# Patient Record
Sex: Male | Born: 1965
Health system: Southern US, Community
[De-identification: ages and names within clinical notes are randomized; demographics above are authoritative.]

## PROBLEM LIST (undated history)

## (undated) DIAGNOSIS — I493 Ventricular premature depolarization: Secondary | ICD-10-CM

## (undated) DIAGNOSIS — E785 Hyperlipidemia, unspecified: Secondary | ICD-10-CM

## (undated) DIAGNOSIS — E669 Obesity, unspecified: Secondary | ICD-10-CM

## (undated) DIAGNOSIS — S42009A Fracture of unspecified part of unspecified clavicle, initial encounter for closed fracture: Secondary | ICD-10-CM

## (undated) DIAGNOSIS — E781 Pure hyperglyceridemia: Secondary | ICD-10-CM

## (undated) DIAGNOSIS — G4700 Insomnia, unspecified: Secondary | ICD-10-CM

## (undated) DIAGNOSIS — F528 Other sexual dysfunction not due to a substance or known physiological condition: Secondary | ICD-10-CM

## (undated) DIAGNOSIS — I472 Ventricular tachycardia, unspecified: Secondary | ICD-10-CM

## (undated) DIAGNOSIS — J301 Allergic rhinitis due to pollen: Secondary | ICD-10-CM

## (undated) HISTORY — DX: Hyperlipidemia, unspecified: E78.5

## (undated) HISTORY — DX: Fracture of unspecified part of unspecified clavicle, initial encounter for closed fracture: S42.009A

## (undated) HISTORY — DX: Obesity, unspecified: E66.9

## (undated) HISTORY — DX: Pure hyperglyceridemia: E78.1

## (undated) HISTORY — DX: Allergic rhinitis due to pollen: J30.1

## (undated) HISTORY — DX: Other sexual dysfunction not due to a substance or known physiological condition: F52.8

## (undated) HISTORY — DX: Ventricular premature depolarization: I49.3

## (undated) HISTORY — DX: Insomnia, unspecified: G47.00

## (undated) HISTORY — PX: RHINOPLASTY: SUR1284

## (undated) HISTORY — PX: CYSTECTOMY: SUR359

## (undated) HISTORY — DX: Ventricular tachycardia, unspecified: I47.20

## (undated) HISTORY — DX: Ventricular tachycardia: I47.2

---

## 2002-03-29 ENCOUNTER — Ambulatory Visit (HOSPITAL_BASED_OUTPATIENT_CLINIC_OR_DEPARTMENT_OTHER): Admission: RE | Admit: 2002-03-29 | Discharge: 2002-03-29 | Payer: Self-pay | Admitting: Internal Medicine

## 2002-07-08 ENCOUNTER — Ambulatory Visit (HOSPITAL_BASED_OUTPATIENT_CLINIC_OR_DEPARTMENT_OTHER): Admission: RE | Admit: 2002-07-08 | Discharge: 2002-07-08 | Payer: Self-pay | Admitting: Otolaryngology

## 2005-09-13 ENCOUNTER — Ambulatory Visit: Payer: Self-pay | Admitting: Internal Medicine

## 2005-09-16 ENCOUNTER — Ambulatory Visit: Payer: Self-pay | Admitting: Internal Medicine

## 2007-02-06 ENCOUNTER — Ambulatory Visit: Payer: Self-pay | Admitting: Cardiology

## 2007-02-06 LAB — CONVERTED CEMR LAB
CO2: 28 meq/L (ref 19–32)
Cholesterol: 244 mg/dL (ref 0–200)
Creatinine, Ser: 1.1 mg/dL (ref 0.4–1.5)
GFR calc Af Amer: 95 mL/min
HDL: 40.7 mg/dL (ref >39.0)
Potassium: 4 meq/L (ref 3.5–5.1)
Sodium: 139 meq/L (ref 135–145)
Triglycerides: 89 mg/dL (ref 0–149)
VLDL: 18 mg/dL (ref 0–40)

## 2007-03-13 ENCOUNTER — Ambulatory Visit: Payer: Self-pay

## 2007-03-14 ENCOUNTER — Ambulatory Visit: Payer: Self-pay | Admitting: Internal Medicine

## 2007-03-14 ENCOUNTER — Ambulatory Visit: Payer: Self-pay

## 2007-03-14 ENCOUNTER — Ambulatory Visit: Payer: Self-pay | Admitting: Cardiology

## 2007-03-14 ENCOUNTER — Inpatient Hospital Stay (HOSPITAL_COMMUNITY): Admission: AD | Admit: 2007-03-14 | Discharge: 2007-03-16 | Payer: Self-pay | Admitting: Cardiology

## 2007-03-21 ENCOUNTER — Ambulatory Visit: Payer: Self-pay | Admitting: Internal Medicine

## 2007-03-21 ENCOUNTER — Ambulatory Visit: Payer: Self-pay

## 2007-11-14 ENCOUNTER — Encounter: Payer: Self-pay | Admitting: *Deleted

## 2007-11-14 DIAGNOSIS — E785 Hyperlipidemia, unspecified: Secondary | ICD-10-CM | POA: Insufficient documentation

## 2007-11-14 DIAGNOSIS — Z9189 Other specified personal risk factors, not elsewhere classified: Secondary | ICD-10-CM | POA: Insufficient documentation

## 2007-11-14 DIAGNOSIS — G4700 Insomnia, unspecified: Secondary | ICD-10-CM | POA: Insufficient documentation

## 2007-11-14 DIAGNOSIS — F528 Other sexual dysfunction not due to a substance or known physiological condition: Secondary | ICD-10-CM | POA: Insufficient documentation

## 2008-05-09 ENCOUNTER — Ambulatory Visit (HOSPITAL_BASED_OUTPATIENT_CLINIC_OR_DEPARTMENT_OTHER): Admission: RE | Admit: 2008-05-09 | Discharge: 2008-05-09 | Payer: Self-pay | Admitting: Otolaryngology

## 2008-05-09 ENCOUNTER — Encounter: Payer: Self-pay | Admitting: Pulmonary Disease

## 2008-05-10 ENCOUNTER — Ambulatory Visit: Payer: Self-pay | Admitting: Internal Medicine

## 2008-06-05 ENCOUNTER — Ambulatory Visit: Payer: Self-pay | Admitting: Pulmonary Disease

## 2008-06-05 DIAGNOSIS — G4733 Obstructive sleep apnea (adult) (pediatric): Secondary | ICD-10-CM | POA: Insufficient documentation

## 2008-06-12 ENCOUNTER — Telehealth: Payer: Self-pay | Admitting: Internal Medicine

## 2008-07-03 ENCOUNTER — Ambulatory Visit: Payer: Self-pay | Admitting: Pulmonary Disease

## 2008-08-19 ENCOUNTER — Encounter: Payer: Self-pay | Admitting: Pulmonary Disease

## 2008-10-20 ENCOUNTER — Telehealth (INDEPENDENT_AMBULATORY_CARE_PROVIDER_SITE_OTHER): Payer: Self-pay | Admitting: *Deleted

## 2008-10-23 ENCOUNTER — Telehealth: Payer: Self-pay | Admitting: Pulmonary Disease

## 2008-11-03 ENCOUNTER — Encounter: Payer: Self-pay | Admitting: Pulmonary Disease

## 2008-11-04 ENCOUNTER — Ambulatory Visit: Payer: Self-pay | Admitting: Pulmonary Disease

## 2008-12-08 ENCOUNTER — Telehealth: Payer: Self-pay | Admitting: Internal Medicine

## 2008-12-09 ENCOUNTER — Encounter: Payer: Self-pay | Admitting: Internal Medicine

## 2009-06-11 ENCOUNTER — Telehealth (INDEPENDENT_AMBULATORY_CARE_PROVIDER_SITE_OTHER): Payer: Self-pay | Admitting: *Deleted

## 2009-07-23 ENCOUNTER — Telehealth: Payer: Self-pay | Admitting: Internal Medicine

## 2009-10-06 ENCOUNTER — Ambulatory Visit: Payer: Self-pay | Admitting: Pulmonary Disease

## 2009-12-22 ENCOUNTER — Telehealth: Payer: Self-pay | Admitting: Internal Medicine

## 2009-12-23 ENCOUNTER — Telehealth: Payer: Self-pay | Admitting: Internal Medicine

## 2010-03-17 ENCOUNTER — Telehealth: Payer: Self-pay | Admitting: Internal Medicine

## 2010-09-07 ENCOUNTER — Telehealth: Payer: Self-pay | Admitting: Internal Medicine

## 2010-09-14 NOTE — Assessment & Plan Note (Signed)
Summary: rov for osa   Copy to:  [facility] Primary Provider/Referring Provider:  Debby Bud  CC:  Yearly follow up for sleep apnea.  states he is wearing cpap everynight approx 7 hours.  states he is having trouble with mask-occasionally it is too loose and occasionally it's too tight.  denies problems with pressure.  Marland Kitchen  History of Present Illness: the pt comes in today for f/u of his known severe osa.  He states that he is wearing cpap compliantly, and denies any issues with daytime sleepiness or alertness.  He is having mask issues, but is due to be replaced, and I suspect this is the issue.  Current Medications (verified): 1)  Zolpidem Tartrate 10 Mg  Tabs (Zolpidem Tartrate) .... Take 1 Tab By Mouth At Bedtime As Needed  Allergies (verified): 1)  ! Penicillin  Review of Systems      See HPI  Vital Signs:  Patient profile:   45 year old male Height:      73 inches Weight:      223.50 pounds BMI:     29.59 O2 Sat:      99 % on Room air Temp:     97.7 degrees F oral Pulse rate:   43 / minute BP sitting:   114 / 72  (left arm) Cuff size:   regular  Vitals Entered By: Gweneth Dimitri RN (October 06, 2009 8:48 AM)  O2 Flow:  Room air CC: Yearly follow up for sleep apnea.  states he is wearing cpap everynight approx 7 hours.  states he is having trouble with mask-occasionally it is too loose and occasionally it's too tight.  denies problems with pressure.   Comments Medications reviewed with patient Daytime contact number verified with patient. Gweneth Dimitri RN  October 06, 2009 8:48 AM    Physical Exam  General:  wd male in nad Nose:  no skin breakdown or pressure necrosis from cpap mask   Impression & Recommendations:  Problem # 1:  OBSTRUCTIVE SLEEP APNEA (ICD-327.23)  the pt denies noncompliance, and feels he is sleeping well with no daytime sleepiness issues.  His compliance download shows he is wearing his device 74% of the nights, and he has no significant  leaks.  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  Other Orders: Est. Patient Level II (04540) DME Referral (DME)  Patient Instructions: 1)  continue on cpap, but wear as much as possible during the night. 2)  will send an order to dme to get you a new mask. 3)  followup with me in one year.

## 2010-09-14 NOTE — Progress Notes (Signed)
  Phone Note Refill Request Message from:  Fax from Pharmacy on Dec 22, 2009 2:55 PM  Refills Requested: Medication #1:  ZOLPIDEM TARTRATE 10 MG  TABS Take 1 tab by mouth at bedtime as needed.   Last Refilled: 07/23/2009 [facility], please Advise refill, thank you  Initial call taken by: Ami Bullins CMA,  Dec 22, 2009 2:55 PM  Follow-up for Phone Call        ok for refills as needed  Follow-up by: Jacques Navy MD,  Dec 22, 2009 5:38 PM    Prescriptions: ZOLPIDEM TARTRATE 10 MG  TABS (ZOLPIDEM TARTRATE) Take 1 tab by mouth at bedtime as needed  #30 x 1   Entered by:   Ami Bullins CMA   Authorized by:   Jacques Navy MD   Signed by:   Bill Salinas CMA on 12/23/2009   Method used:   Telephoned to ...       [facility] [address] [phone]* (retail)       [address]       [place], [place]  [address]       Ph: [phone] or [phone]       Fax: [phone]   RxID:   [id number]

## 2010-09-14 NOTE — Progress Notes (Signed)
  Phone Note Refill Request Message from:  Fax from Pharmacy on March 17, 2010 1:06 PM  Refills Requested: Medication #1:  ZOLPIDEM TARTRATE 10 MG  TABS Take 1 tab by mouth at bedtime as needed. [facility] [address]. Please Advise refill.  Initial call taken by: Ami Bullins CMA,  March 17, 2010 1:06 PM  Follow-up for Phone Call        ok for refills as needed  Follow-up by: Jacques Navy MD,  March 17, 2010 3:41 PM    Prescriptions: ZOLPIDEM TARTRATE 10 MG  TABS (ZOLPIDEM TARTRATE) Take 1 tab by mouth at bedtime as needed  #30 x 1   Entered by:   Ami Bullins CMA   Authorized by:   Jacques Navy MD   Signed by:   Bill Salinas CMA on 03/17/2010   Method used:   Telephoned to ...       [facility] [address] [phone]* (retail)       [address]       [place], [place]  [address]       Ph: [phone] or [phone]       Fax: [phone]   RxID:   [id number]

## 2010-09-14 NOTE — Progress Notes (Signed)
Summary: Zolpidem refill  Phone Note Refill Request Message from:  Fax from Pharmacy on Dec 23, 2009 11:43 AM  Refills Requested: Medication #1:  ZOLPIDEM TARTRATE 10 MG  TABS Take 1 tab by mouth at bedtime as needed. Next Appointment Scheduled: none Initial call taken by: Lucious Groves,  Dec 23, 2009 11:43 AM  Follow-up for Phone Call        k, as needed  Follow-up by: Jacques Navy MD,  Dec 24, 2009 5:03 AM    Prescriptions: ZOLPIDEM TARTRATE 10 MG  TABS (ZOLPIDEM TARTRATE) Take 1 tab by mouth at bedtime as needed  #30 x 1   Entered by:   Ami Bullins CMA   Authorized by:   Jacques Navy MD   Signed by:   Bill Salinas CMA on 12/24/2009   Method used:   Telephoned to ...       [facility] [address] [phone]* (retail)       [address]       [place], [place]  [address]       Ph: [phone] or [phone]       Fax: [phone]   RxID:   [id number]

## 2010-09-16 NOTE — Progress Notes (Signed)
  Phone Note Refill Request Message from:  Fax from Pharmacy on September 07, 2010 11:27 AM  Refills Requested: Medication #1:  ZOLPIDEM TARTRATE 10 MG  TABS Take 1 tab by mouth at bedtime as needed.   Last Refilled: 05/22/2010 [facility] [address], Please Advise refills  Initial call taken by: Ami Bullins CMA,  September 07, 2010 11:28 AM  Follow-up for Phone Call        ok to refill as needed  Follow-up by: Jacques Navy MD,  September 07, 2010 11:29 AM    Prescriptions: ZOLPIDEM TARTRATE 10 MG  TABS (ZOLPIDEM TARTRATE) Take 1 tab by mouth at bedtime as needed  #30 x 1   Entered by:   Ami Bullins CMA   Authorized by:   Jacques Navy MD   Signed by:   Bill Salinas CMA on 09/08/2010   Method used:   Telephoned to ...       [facility] [address] [phone]* (retail)       [address]       [place], [place]  [address]       Ph: [phone] or [phone]       Fax: [phone]   RxID:   [id number]

## 2010-11-15 ENCOUNTER — Other Ambulatory Visit: Payer: Self-pay | Admitting: *Deleted

## 2010-11-15 NOTE — Telephone Encounter (Signed)
Ok to refill prn.

## 2010-11-16 MED ORDER — ZOLPIDEM TARTRATE 10 MG PO TABS: 10.0000 mg | ORAL_TABLET | Freq: Every evening | ORAL | Status: DC | PRN

## 2010-11-16 NOTE — Telephone Encounter (Signed)
Left a message on Pharmacy line for PRN refill on Zolpidem.

## 2010-12-28 NOTE — Procedures (Signed)
[facility]                              EXERCISE TREADMILL   NAME:Larry Rivera, [name]                        MRN:          [id number]  DATE:03/21/2007                            DOB:          1966/07/15    PROCEDURE PERFORMED:  Exercise treadmill test.   INDICATIONS:  Previous exercise-induced ventricular tachycardia, now  with the patient on verapamil to see if we can reinduce ventricular  tachycardia on medical therapy.   INTRODUCTION:  The patient is a very pleasant 45 year old man with a  history of tobacco abuse and hypertension, who was here for a treadmill  test just over a week ago and during that treadmill test he walked  approximately 9 minutes on a Bruce protocol and in recovery had very  sustained, very significant salvos of ventricular tachycardia.  These  were not sustained.  They were clinically significant.  They were  associated with lightheadedness and dizziness.  He was hospitalized.  He  ruled out for MI.  He had a catheterization demonstrating no obstructive  coronary disease and preserved LV function.  The patient with all of the  above underwent cardiac MRI scanning, which was negative, and no  evidence of any infiltrative cardiomyopathy and is now referred for  exercise treadmill test to see if he still has exercise-induced  arrhythmias.   PROCEDURE:  After informed consent was obtained, the patient was prepped  in the usual manner.  His initial blood pressure was 118/55, pulse was  80 and irregular.  The patient walked a total of 9 minutes on a Bruce  protocol, completing stage III.  His heart rate increased up to 131  beats per minute.  His blood pressure was initially in the 130s,  subsequently dropped down as low as 108 and then increased to 136 in  recovery.  During exercise the patient had frequent PVCs, sometimes in a  bigeminal and trigeminal fashion, but had no sustained VT.  He had no  nonsustained VT as well.  There  were no EKG changes consistent with  ischemia.  Of note, the patient did have frequent PVCs.  These were with  a right bundle, upright-axis QRS morphology consistent with a left  ventricular outflow tract origin.  As noted before, there was no  evidence of ischemia.   COMPLICATIONS:  There were no immediate procedural complications.   RESULTS:  This demonstrates a clinically and electrically negative  exercise treadmill test.  There was no inducible ventricular  tachycardia.  The patient's procedure was stopped secondary to MD  discretion.   Recommendations at this time will be for the patient to continue taking  verapamil and Lipitor, and I have recommended that he be allowed to  return to work.     Doylene Canning. Ladona Ridgel, MD  Electronically Signed    GWT/MedQ  DD: 03/21/2007  DT: 03/22/2007  Job #: [id number]

## 2010-12-28 NOTE — H&P (Signed)
Larry Rivera, Larry Rivera                 ACCOUNT NO.:  [id number]   MEDICAL RECORD NO.:  [id number]          PATIENT TYPE:  INP   LOCATION:  2807                         FACILITY:  [facility]   PHYSICIAN:  Doylene Canning. Ladona Ridgel, MD    DATE OF BIRTH:  Apr 23, 1966   DATE OF ADMISSION:  03/14/2007  DATE OF DISCHARGE:                              HISTORY & PHYSICAL   CARDIOLOGIST:  Dr. Rollene Rotunda.   PRIMARY CARE PHYSICIAN:  Dr. Debby Bud.   CHIEF COMPLAINT:  Palpitations and chest pain.   HISTORY OF PRESENT ILLNESS:  Larry Rivera is a 45 year old male patient,  without any known history of coronary artery disease, who presented to  the office today for routine exercise treadmill testing.  He recently  saw Dr. Antoine Poche for evaluation of chest pain and palpitations.  He was  set up with a 24-hour Holter monitor, as well as some blood work, an  echocardiogram and his exercise treadmill test.  He completed his  echocardiogram earlier today.  Recent blood work revealed potassium of  4, creatinine 1.1, magnesium is 2.1, total cholesterol 244, HCL 40.7 and  LDL 0.175, TSH 1.79.  Result of his echocardiogram are unknown at this  time.   The patient exercised according to Bruce protocol for 13 minutes,  achieving a work level of 15.3 minutes.  His resting heart rate rose  from 88 beats per minutes to a maximal heart rate of 162 beats per  minute.  His blood pressure rose from 120/83 to maximum of 162/69.  He  denied any chest pain.  He did have some shortness of breath that was  fairly mild.  We achieved 100 percent of his maximum predicted heart  rate and discontinued the test.   In recovery, the patient developed monomorphic ventricular tachycardia.  This was fairly asymptomatic.  His heart rate was 244.  He had two  episodes of this, the longest of which lasted approximately 6 seconds.  The patient denied chest pain, shortness of breath.  He denied any chest  pain, shortness of breath.  He denied any near  syncope.  Of note during  exercise, the patient had no ST-T wave changes to suggest ischemia or  injury.  He did have frequent PVCs, especially early on in the test.  He  did have several episodes of bigeminy and trigeminy before we started  the test, but this resolved during exercise.   The patient did become somewhat lightheaded and diaphoretic, much later  in recovery.  His heart rate at that time was 90, and he was in sinus  rhythm.  We did check his blood pressure and it was 80/50.  We placed  him on IV fluids and bolused him with 250 mL of normal saline.  His  blood pressure returned to 120/80.  Throughout the remainder of  recovery, he remained in sinus rhythm with frequent PVCs, bigeminy and  trigeminy.   PAST MEDICAL HISTORY:  Significant for untreated dyslipidemia.   MEDICATIONS:  Zolpidem p.r.n.   ALLERGIES:  PENICILLIN.   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  He has one 71-  year-old daughter.  He smokes one pack per day for last 20 years.  He  drinks alcohol socially.   FAMILY HISTORY:  Noncontributory for premature coronary artery disease.   REVIEW OF SYSTEMS:  Please see HPI.  Rest of the review of systems are  negative.   PHYSICAL EXAM:  GENERAL:  Well-nourished, well-developed male in no  distress.  VITAL SIGNS:  Blood pressure is as noted above.  Heart rate is as noted  above in HPI.  HEENT is normal.  NECK:  Without JVD.  CARDIAC:  S1, S2.  Regular rate and rhythm with occasional PVCs.  LUNGS:  Clear to auscultation bilaterally without wheezing, rhonchi or  rales.  ABDOMEN:  Nontender.  EXTREMITIES:  No edema.  Calves soft, nontender.  SKIN:  Warm, dry.  NEUROLOGIC:  He is alert and oriented x3.  Cranial 2-  12 grossly intact.  Carotids without bruits bilaterally.   IMPRESSION:  1. Monomorphic ventricular tachycardia, status post routine exercise      treadmill test - spontaneous conversion to normal sinus rhythm.      a.     Fairly  asymptomatic.  2. History of atypical chest pain.  3. Smoker.  4. Untreated dyslipidemia.   PLAN:  The patient was also interviewed and examined by Dr. [name].  We plan to admit him to [facility].  We will place in  a step-down unit and start him on aspirin, beta blocker and Statin  therapy.  Will also place him on IV heparin.  Will check serial cardiac  markers.  We recommend proceeding with cardiac catheterization.  Risks  and benefits have been discussed with patient. He agrees proceed.  This  will be planned for tomorrow.  If his coronaries are normal, then he  will require electrophysiology study.      Larry Newcomer, PA-C      Doylene Canning. Ladona Ridgel, MD  Electronically Signed    SW/MEDQ  D:  03/14/2007  T:  03/15/2007  Job:  [id number]   cc:   Rosalyn Gess. Norins, MD

## 2010-12-28 NOTE — Assessment & Plan Note (Signed)
[facility]                            CARDIOLOGY OFFICE NOTE   NAME:Rivera, Larry JIVIDEN                        MRN:          [id number]  DATE:02/06/2007                            DOB:          1966/05/18    PRIMARY CARE PHYSICIAN:  Dr. Illene Regulus.   REASON FOR REFERAL:  Evaluate patient with palpitations and pre syncope.   HISTORY OF PRESENT ILLNESS:  The patient is a pleasant 45 year old  gentleman with no prior cardiac history. He has noticed for some time  now episodes of feeling flushed with cold sweats and slightly  lightheaded. This happens sporadically. He has not had any in a month  and a half. His wife does notice that he occasionally has some irregular  heart beats when she is listening or has her head on his chest. He does  not really notice these. He is not sure whether it correlates. He thinks  he has more of these symptoms when he is drinking a lot of caffeine. He  does not exercise routinely. His activities of daily living can not  bring these symptoms on. He is not having any chest discomfort, neck, or  arm discomfort. He is not having any PND or orthopnea. He may get winded  doing significant activity. His exercise tolerance has been reasonable.  He is a longstanding smoker.   PAST MEDICAL HISTORY:  He has no history of hypertension, diabetes,  hyperlipidemia.   PAST SURGICAL HISTORY:  Cyst removed.   ALLERGIES:  PENICILLIN.   MEDICATIONS:  Zolpidem.   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 He has  one 53 year old daughter. He has been smoking 1 pack per day for 20  years. He drinks alcohol socially.   FAMILY HISTORY:  Noncontributory for early coronary artery disease.  There is a history of diabetes in his family.   REVIEW OF SYSTEMS:  Positive for erectile dysfunction. Negative for  other systems.   PHYSICAL EXAMINATION:  The patient is well-appearing and in no distress.  Blood pressure 125/80,  heart rate 70 regular, weight 218 pounds, body  mass index 28.  HEENT: Eyelids unremarkable, pupils equal, round, and reactive to light.  Fundi not visualized. Oral mucosa unremarkable.  NECK: No jugular venous distension at 45 degrees. Carotid upstroke brisk  and symmetric. No bruits or thyromegaly.  LYMPHATICS: No cervical, axillary, or inguinal adenopathy.  LUNGS: Clear to auscultation bilaterally.  BACK: No costovertebral angle tenderness.  CHEST: Unremarkable.  HEART: PMI not displaced or sustained. S1 and S2 within normal limits.  No S3. No S4, clicks, rubs, no murmurs.  ABDOMEN: Obese, positive bowel sounds normal to frequency and pitch. No  bruits, rebound, guarding. No midline pulsatile mass. No hepatomegaly,  no splenomegaly.  SKIN: No rashes. No nodules.  EXTREMITIES: 2 + pulses throughout. No edema, no cyanosis, no clubbing.  NEURO: Oriented to person, place, and time. Cranial nerves II-XII  grossly intact. Motor grossly intact.   EKG: Sinus rhythm, rate 70, axis within normal limits, intervals within  normal limits, sinus arrhythmia. No acute ST-T wave  changes.   ASSESSMENT/PLAN:  1. Palpitations, the patient did have some ectopy while I was      listening. I am going to get a 24 hour Holter monitor. I am going      to get a TSH, BMET, and magnesium. I will check an echocardiogram.      Further evaluation will be based on these results. He is going to      be instructed that he needs to avoid caffeine as a first step.  2. Erectile dysfunction, I would give the patient a prescription for      Viagra or other such drug. Given his risk factors, I think an      exercise treadmill test to rule out cardiovascular disease is      warranted. We will bring him back and do an exercise treadmill test      and then I will give him a prescription for exercise based on this.  3. Tobacco, I spent a long time talking about the need to stop      smoking. He is not allowed to take  Chantix according to his job. He      will consider Wellbutrin if he can not quit cold [place].  4. Erectile dysfunction as above.  5. Follow up, I will see him at the time of his treadmill.     Rollene Rotunda, MD, [facility]  Electronically Signed    JH/MedQ  DD: 02/06/2007  DT: 02/06/2007  Job #: [id number]   cc:   Rosalyn Gess. Norins, MD

## 2010-12-28 NOTE — Cardiovascular Report (Signed)
Larry Rivera, Larry Rivera                 ACCOUNT NO.:  [id number]   MEDICAL RECORD NO.:  [id number]          PATIENT TYPE:  INP   LOCATION:  2915                         FACILITY:  [facility]   PHYSICIAN:  Arturo Morton. Riley Kill, MD, FACCDATE OF BIRTH:  [birth year]   DATE OF PROCEDURE:  03/15/2007  DATE OF DISCHARGE:                            CARDIAC CATHETERIZATION   INDICATIONS:  [name] is a 45 year old gentleman who presents with  exercise-induced ventricular tachycardia and presyncope.  This was  provoked on the treadmill.  The current study was done to assess  coronary anatomy.  He has a history of hypercholesterolemia, and he also  has a history of smoking.   PROCEDURE:  1. Left heart catheterization.  2. Selective coronary territory.  3. Selective left ventriculography.   DESCRIPTION OF PROCEDURE:  The patient was brought to the  catheterization laboratory, prepped and draped in usual fashion.  Through an anterior puncture, the right femoral artery was easily  entered.  A 5-French sheath was placed.  Views of the left and right  coronary arteries were then obtained.  A no-torque catheter was used for  right coronary injection.  Central aortic and left ventricular pressures  measured with pigtail.  Ventriculography was performed in the RAO  projection.  The LVEDP was low and blood pressure was borderline so 250  mL of normal saline was administered through the course of the  procedure.  He tolerated this well and was taken to the holding area in  satisfactory clinical condition.  ACT was checked at the end of the  case.   HEMODYNAMIC DATA:  1. Central aortic pressure was 88/63, mean 75.  2. Left ventricular pressure 95/5.  3. No gradient pull-back across aortic valve.   ANGIOGRAPHIC DATA:  1. Ventriculography done in the RAO projection.  Overall systolic      function appears at the low normal range.  Estimated ejection      fraction would be in the 50% range.  Does not appear to  be      significant regurgitation.  There is not a definite wall motion      abnormality.  2. The left main is free of critical disease.  3. The LAD is a large vessel with perhaps some mild ectasia.  There is      minimal luminal irregularity.  There are two diagonal branches, all      of which appear free of critical disease.  4. The circumflex provides a large marginal with multiple branches.      It is free of critical disease.  5. Right coronary is large-caliber vessel without significant focal      narrowing in multiple posterolateral branches.   CONCLUSION:  1. Low normal ejection fraction.  2. No significant coronary obstruction.  3. Exercise-induced ventricular tachycardia of uncertain etiology.   PLAN:  The patient is to have a cardiac MRI and be seen in follow-up  also with the electrophysiologic service.  I will speak with Dr. [name] about his case.      Arturo Morton. Riley Kill, MD, [name]  Electronically Signed     TDS/MEDQ  D:  03/15/2007  T:  03/16/2007  Job:  [id number]   cc:   Doylene Canning. Ladona Ridgel, MD  Noralyn Pick Eden Emms, MD, [facility]  CV Laboratory

## 2010-12-28 NOTE — Procedures (Signed)
NAME:  Larry Rivera, Larry Rivera                 ACCOUNT NO.:  [id number]   MEDICAL RECORD NO.:  [id number]          PATIENT TYPE:  OUT   LOCATION:  SLEEP CENTER                 FACILITY:  [facility]   PHYSICIAN:  Clinton D. Maple Hudson, MD, FCCP, FACPDATE OF BIRTH:  07/29/1966   DATE OF STUDY:  05/09/2008                            NOCTURNAL POLYSOMNOGRAM   REFERRING PHYSICIAN:  Onalee Hua L. Annalee Genta, M.D.   INDICATION FOR STUDY:  Hypersomnia with sleep apnea.   EPWORTH SLEEPINESS SCORE:  Endorsed by patient as zero/24, BMI 30,  weight 225 pounds, height 73 inches, neck 17 inches.   HOME MEDICATION:  None reported.   SLEEP ARCHITECTURE:  Split study protocol.  During the diagnostic phase,  total sleep time was 119.5 minutes with sleep efficiency 86.3%.  Stage 1  was 2.1%, stage 2 was 89.1%, and stage 3 was 5.9%, REM 2.9% of total  sleep time.  Sleep latency 9 minutes, REM latency 124 minutes, awake  after sleep onset 10 minutes, arousal index 47.2 indicating increased  EEG arousal.  Zolpidem was taken at bedtime.   RESPIRATORY DATA:  Split study protocol.  Apnea/hypopnea index (AHI)  86.4 per hour.  172 events were scored including 105 obstructive apneas,  114 central apneas, 11 mixed apneas, and 42 hypopneas before CPAP.  All  events were recorded as supine.  REM AHI 102.9.  CPAP was titrated to 12  CWP, AHI 4.1 per hour.  He chose a large ResMed Mirage Quattro mask with  heated humidifier.   OXYGEN DATA:  Loud snoring before CPAP with oxygen desaturation to a  nadir of 86%.  After CPAP control, mean oxygen saturation held 96.4% on  room air.   CARDIAC DATA:  Normal sinus rhythm with occasional PVC.   MOVEMENT/PARASOMNIA:  Occasional limb jerk during CPAP titration with an  arousal index of 3.  Bathroom x1.   IMPRESSION/RECOMMENDATION:  1. Severe obstructive sleep apnea/hypopnea syndrome, apnea/hypopnea      index 86.4 per hour with all events recorded while supine, loud      snoring with oxygen  desaturation to a nadir of 86%.  2. Successful continuous positive airway pressure (CPAP) titration to      12 centimeters of water, apnea/hypopnea index 4.1 per hour.  He      chose a large ResMed Mirage Quattro mask with heated humidifier.      Clinton D. Maple Hudson, MD, [facility], FACP  Diplomate, Biomedical engineer of Sleep Medicine  Electronically Signed     CDY/MEDQ  D:  05/10/2008 10:53:04  T:  05/11/2008 15:37:40  Job:  [id number]

## 2010-12-28 NOTE — Consult Note (Signed)
Larry Rivera, Larry Rivera                 ACCOUNT NO.:  [id number]   MEDICAL RECORD NO.:  [id number]          PATIENT TYPE:  INP   LOCATION:  6523                         FACILITY:  [facility]   PHYSICIAN:  Doylene Canning. Ladona Ridgel, MD    DATE OF BIRTH:  Oct 31, 1965   DATE OF CONSULTATION:  03/15/2007  DATE OF DISCHARGE:                                 CONSULTATION   REQUESTED BY:  Dr. Charlton Haws.   INDICATIONS FOR CONSULTATION:  Evaluation of symptomatic ventricular  tachycardia.   HISTORY OF PRESENT ILLNESS:  The patient is a 45 year old man with a  history of tobacco abuse and atypical chest pain, who was seen by my  partner, Dr. Antoine Poche, initially for evaluation of all of the above.  An  exercise treadmill test was ordered.  The patient notes that prior to  his exercise test, he had 7 or 8 cups of caffeine (Expressor).  The  patient subsequently underwent exercise treadmill testing where he  walked for 12 minutes on a Bruce protocol.  There were no EKG changes.  Approximate 1-1/2 minutes into recovery, the patient developed  ventricular tachycardia at a cycle length of 300 milliseconds.  This  tachycardia was a right bundle branch block tachycardia with positive  precordial concordance.  Lead I was negative, as was aVL, as was aVR,  though less so.  The patient's tachycardia spontaneously terminated.  He  did not have syncope.  Following this, the patient was in ventricular  bigeminy.  He subsequently was admitted to the hospital and underwent  catheterization by Dr. Bonnee Quin, which demonstrated preserved LV  function, no obvious focal wall motion abnormalities and normal coronary  arteries with no evidence of obstruction.  He has had some ventricular  ectopy on the monitor, but has otherwise been stable.   PAST MEDICAL HISTORY:  As noted in the HPI; otherwise, he has been quite  healthy.  Patient has a surgical history notable for removal of a cyst.   ALLERGIES:  PENICILLIN.   SOCIAL  HISTORY:  The patient works as a Naval architect.  He is married.  He has a 1-pack-per-day smoking history and has done so for 20 years.  He drinks alcohol socially.   FAMILY HISTORY:  Negative for coronary disease.   REVIEW OF SYSTEMS:  Notable for erectile dysfunction, but otherwise all  systems reviewed and found to be negative.   PHYSICAL EXAMINATION:  GENERAL:  He is a pleasant, well-appearing young  man in no acute distress.  VITAL SIGNS:  The blood pressure was 100/70, the pulse was 60 and  irregular, the respirations were 18 and temperature was 98.  HEENT:  Normocephalic and atraumatic.  Pupils were equal and round.  The  oropharynx was moist.  The sclerae were anicteric.  NECK:  Revealed no jugular distention.  There is no thyromegaly.  Trachea is midline.  The carotid are 2+ and symmetric.  LUNGS:  Clear bilaterally to auscultation, no wheezes, rales or rhonchi  were present.  There is no increased work of breathing.  CARDIOVASCULAR:  Exam revealed an irregular rhythm with  normal S1 and S2.  There are no  murmurs, rubs or gallops present.  ABDOMEN:  Soft, nontender and distended.  There was no organomegaly.  The bowel sounds are present.  There is no rebound or guarding.  EXTREMITIES:  Demonstrated no cyanosis, clubbing or edema.  Pulses were  2+ and symmetric.  NEUROLOGIC:  Alert and oriented x3. His cranial nerves were intact.  The  strength is 5/5 and symmetric.  SKIN:  Exam was normal.   EKG:  Baseline ECG demonstrates sinus rhythm with normal axis and  intervals.   IMPRESSION:  1. Nonsustained, but symptomatic ventricular tachycardia with a right      bundle and left inferior axis positive precordial concordance QRS      morphology.  2. Atypical chest pain with catheterization demonstrating no coronary      artery disease.  3. Tobacco abuse.   DISCUSSION:  The patient's mechanism of his VT likely is automatic,  perhaps adenosine sensitive and originating from the  left versus the  noncoronary cusp of the left ventricular outflow tract.  I have  recommended that we start verapamil and rechallenge the patient next  week with exercise treadmill testing.  Catheter ablation will be another  option, but for now I would recommend medical therapy to see if we can  suppress his VT.  If his VT is in fact suppressible with medical  therapy, then returning to driving would be a reasonable recommendation.      Doylene Canning. Ladona Ridgel, MD  Electronically Signed     GWT/MEDQ  D:  03/15/2007  T:  03/16/2007  Job:  [id number]   cc:   Rollene Rotunda, MD, Simone Curia. Norins, MD

## 2010-12-28 NOTE — Discharge Summary (Signed)
NAMEKOI, ZANGARA                 ACCOUNT NO.:  [id number]   MEDICAL RECORD NO.:  [id number]          PATIENT TYPE:  INP   LOCATION:  6523                         FACILITY:  [facility]   PHYSICIAN:  Peter C. Eden Emms, MD, FACCDATE OF BIRTH:  January 21, 1966   DATE OF ADMISSION:  03/14/2007  DATE OF DISCHARGE:  03/16/2007                         DISCHARGE SUMMARY - REFERRING   DISCHARGE DIAGNOSES:  1. Atypical chest discomfort with clean coronaries on cardiac      catheterization.  2. Monomorphic ventricular tachycardia during recovery on his stress      test.  3. Tobacco use.  4. Hyperlipidemia.   PROCEDURES PERFORMED:  Cardiac catheterization on March 15, 2007 by Dr.  Riley Kill.   BRIEF HISTORY:  Mr. Quant is a 45 year old male who was recently seen by  Dr. Antoine Poche secondary to palpitations and atypical chest discomfort.  An exercise stress test was set up to rule out ischemic heart disease.  He did well on the exercise portion without chest discomfort or EKG  changes.  However, in recovery, he developed monomorphic ventricular  tachycardia with a heart rate of 244.  He broke on his own and was  asymptomatic.  An echocardiogram had also been done in the office,  however, is pending this dictation.  He was admitted to the hospital for  further evaluation.   His history is also notable for tobacco use and hyperlipidemia that it  is untreated.   LABORATORY:  Cardiac MRI showed normal cardiac chambers, EF 58% without  wall motion abnormalities, no evidence of RV dysplasia or fatty  infiltration of the right ventricular free wall.   Laboratory data shows admission H&H 14.0 and 40.8, normal indices,  platelets 271, WBCs 11.3.  At the time of discharge, H&H was 12.9 and  38.3, normal indices, platelets 250, WBCs 9.4.  PTT 30, PT 13.8.  Sodium  137, potassium 4.0, BUN 9, creatinine 1.08.  Normal LFTs.  Prior to  discharge, sodium 138, potassium 3.6, BUN 10, creatinine 1.0, glucose  96.   CK-MBs, relative indices and troponins were within normal limits.  TSH was 1.150.  EKG showed normal sinus rhythm, normal axis.   HOSPITAL COURSE:  The patient was admitted to 6500, placed on IV  heparin.  Given his ventricular tachycardia, it was felt that he should  undergo cardiac catheterization to rule out coronary artery disease.  Catheterization was performed on March 15, 2007 by Dr. Riley Kill.  This did  not show any evidence of coronary artery disease, normal LV function.  Cardiac MRI was performed.  Tobacco cessation consult was performed.  EP  consult was obtained.  Dr. Ladona Ridgel described a ventricular tachycardia  with a right bundle, left inferior axis, with positive precordial  concordant.  He felt that his mechanism of his VT was likely autonomic,  question adenosine sensitive and likely originating from the left versus  noncoronary cusp.  He recommended beginning verapamil or rechallenging  him on a stress test next week.  If he did not have any further  ventricular tachycardia or verapamil, then he could return to driving,  as SVT  is not sustained and unassociated with syncope.  On August 1, he  did not have any further dysrhythmias Dr. Ladona Ridgel followed up,  recommended discontinuing beta blocker.  He received 180 mg of verapamil  prior to his discharge.   DISPOSITION:  The patient is discharged home.   His medications include:  1. Aspirin 325 mg daily.  2. Lipitor 40 mg every night.  3. Ambien 10 mg every night.  4. Verapamil SR 240 mg daily.   According to Dr. Ladona Ridgel, he would like him to have an exercise treadmill  on March 21, 2007 with a follow-up appointment with him right after the  treadmill.  The office will call him with these arrangements.  He will  need blood work in 6 to 8 weeks in regards to FLP and LFTs.  Since  Lipitor was initiated, he was advised no smoking or tobacco products, to  bring all medications to all appointments.  He will follow up with Dr.   Antoine Poche as scheduled.   Discharge time 35 minutes.  Primary care physician is Dr. Debby Bud.      Joellyn Rued, PA-C      Noralyn Pick. Eden Emms, MD, [facility]  Electronically Signed    EW/MEDQ  D:  03/16/2007  T:  03/16/2007  Job:  [id number]   cc:   Rosalyn Gess. Norins, MD

## 2010-12-31 NOTE — Op Note (Signed)
NAME:  Larry Rivera, Larry Rivera                           ACCOUNT NO.:  [id number]   MEDICAL RECORD NO.:  [id number]                   PATIENT TYPE:  AMB   LOCATION:  DSC                                  FACILITY:  [facility]   PHYSICIAN:  Legend L. Annalee Genta, M.D.            DATE OF BIRTH:  01/03/1966   DATE OF PROCEDURE:  07/08/2002  DATE OF DISCHARGE:                                 OPERATIVE REPORT   PREOPERATIVE DIAGNOSES:  1. Severely deviated nasal septum with nasal obstruction.  2. History of nasal trauma.  3. Inferior turbinate and intramural cautery.  4. History of obstructive sleep apnea.   POSTOPERATIVE DIAGNOSES:  1. Severely deviated nasal septum with nasal obstruction.  2. History of nasal trauma.  3. Inferior turbinate and intramural cautery.  4. History of obstructive sleep apnea.   OPERATION PERFORMED:  1. Nasal septal reconstruction.  2. Bilateral inferior turbinate intramural cautery.   SURGEON:  Kinnie Scales. Annalee Genta, M.D.   ANESTHESIA:  General endotracheal.   COMPLICATIONS:  None.   ESTIMATED BLOOD LOSS:  Less than 50 cc.   The patient was transferred from the operating room to the recovery room in  stable condition.   INDICATIONS FOR PROCEDURE:  The patient is a 45 year old white male who has  been followed with a history of progressive night time airway obstruction,  severe nasal congestion and deviated nasal septum.  Inpatient sleep study  was performed which showed an REI of 16 events per hour.  The patient had  complaints of daytime fatigue and hypersomnolence associated with  obstructive sleep apnea.  He had a severely deviated nasal septum and was  unable to tolerate nasal CPAP secondary to sensation of obstruction and  congestion.  Given the patient's history and examination, I recommended that  we consider him for nasal septal reconstruction and bilateral inferior  turbinate and intramural cautery in order to improve his nasal airway,  improve night time  breathing  and perhaps to facilitate night time use of  CPAP.  The risks, benefits and possible complications of the surgical  procedure were discussed in detail with the patient and his wife, who  understood and concurred with our plan for surgery which was scheduled as  above.   DESCRIPTION OF PROCEDURE:  The patient was brought to the operating room on  July 08, 2002 and placed in supine position on the operating table.  General endotracheal anesthesia was established without difficulty and when  the patient was adequately anesthetized, he was injected with approximately  10 cc of 1% lidocaine 1:100,000 dilution of epinephrine injected in  submucosal fashion along the nasal septum and inferior turbinates  bilaterally.  The patient's nose was then packed with Afrin soaked cottonoid  pledgets.  These were left in place for approximately 10 minutes for  vasoconstriction.  The patient was then prepped and draped in sterile  fashion.  The surgical procedure was  begun with removal of nasal Cottonoids  and bilateral nasal examination.  The patient had a severely deviated  anterior nasal septum with the columellar cartilage completely deviated to  the left outside the natural mucoperichondrial pocket with near complete  obstruction of the left anterior nasal cavity.  The patient also had  severely deviated bony and cartilaginous septum in the mid and posterior  aspects of the nasal cavity occluding the right hand side, and medial side  with inferior turbinate hypertrophy.  The procedure was begun by creating a  left hemitransfixion incision.  This was carried through the mucosa, the  underlying submucosa and a mucoperichondrial flap was elevated from anterior  to posterior along the left aspect of the nasal septum.  The overlying  mucosa was preserved.  The bony cartilaginous junction was crossed at the  midline and mucoperiosteal flap was elevated along the patient's right hand  side.   Deviated bone and cartilage from the mid, posterior and inferior  aspects of the nasal septum were then resected with a 4 mm osteotome,  reserving the anterior dorsal and columellar cartilage.  With the bony  portion of the septum in midline position, attention was then turned to the  anterior aspect of the nasal septum where there was a severe  deviation of  the anterior columellar cartilage with complete obstruction of the left  nasal cavity.  An incision was created along the patient's right hand side,  creating a full through-and-through transfixion incision and the cartilage  was freed from the mucoperichondrial pocket.  Dissection was then carried  out bilaterally allowing complete dissection of the anterior nasal septal  cartilage.  Large curvilinear fractured spur of cartilage was then resected  and the cartilage was mobilized from the anterior maxillary crest.  It was  then moved into a midline position and the natural mucoperichondrial pocket  and lower lateral cartilage crura were then positioned properly along each  side of the nasal septum.  Using a 4-0 PDS suture, the anterior columellar  cartilage was sutured into its pocket and additional previously resected  cartilage was morselized and placed posteriorly in order to create better  strength and straightening of the nasal septum.  Mucoperichondrial flaps  were then reapproximated with a 4-0 gut suture on a Keith needle in a  horizontal mattressing fashion with multiple sutures made to reapproximate  the mucoperichondrium and close the transfixion incision.  Bilateral Doyle  nasal septal splints were then placed on each side of the nasal septum after  the application of Bactroban ointment and was sutured into position with 3-0  Ethilon suture.   The inferior turbinate cautery was then performed with cautery set at 12 watts, two passes were made in the submucosal fashion in each inferior  turbinates.  Both turbinates had been  adequately cauterized, they were  outfractured to create a more patent nasal cavity.  The patient's nasal  cavity was irrigated and suctioned.  The oral cavity was suctioned and the  orogastric tube was passed into the stomach and contents were aspirated.  The patient was awakened from his anesthetic.  He was extubated and was then  transferred from the operating room to the recovery room in stable  condition.  There were no complications.  The estimated blood loss was less  than 50 cc.  Kinnie Scales. Annalee Genta, M.D.    DLS/MEDQ  D:  11/91/4782  T:  07/08/2002  Job:  [id number]

## 2011-02-18 ENCOUNTER — Telehealth: Payer: Self-pay | Admitting: *Deleted

## 2011-02-18 MED ORDER — ZOLPIDEM TARTRATE 10 MG PO TABS: 10.0000 mg | ORAL_TABLET | Freq: Every evening | ORAL | Status: DC | PRN

## 2011-02-18 NOTE — Telephone Encounter (Signed)
Refill request on Zolpidem tartrate 10 mg tablet SIG 1 tablet at bedtime prn. QTY 30 last filled 12/17/2010. Please Advise refills

## 2011-02-18 NOTE — Telephone Encounter (Signed)
OK x 5

## 2011-05-30 LAB — CARDIAC PANEL(CRET KIN+CKTOT+MB+TROPI)
CK, MB: 1.2
CK, MB: 1.8
Relative Index: 1.3
Relative Index: INVALID
Total CK: 96

## 2011-05-30 LAB — TSH: TSH: 1.15

## 2011-05-30 LAB — CBC
HCT: 38.3 — ABNORMAL LOW
HCT: 40.3
HCT: 40.8
Hemoglobin: 12.9 — ABNORMAL LOW
Hemoglobin: 13.6
Hemoglobin: 14
MCHC: 33.8
MCHC: 34.2
MCV: 90.1
MCV: 90.7
RBC: 4.5
RDW: 13.8
RDW: 13.4
WBC: 10.2

## 2011-05-30 LAB — PROTIME-INR: Prothrombin Time: 13.8

## 2011-05-30 LAB — BASIC METABOLIC PANEL
CO2: 27
Glucose, Bld: 96
Potassium: 3.6
Sodium: 138

## 2011-05-30 LAB — COMPREHENSIVE METABOLIC PANEL
ALT: 34
CO2: 29
Calcium: 8.9
GFR calc non Af Amer: >60
Glucose, Bld: 145 — ABNORMAL HIGH
Sodium: 137

## 2011-08-08 ENCOUNTER — Ambulatory Visit: Payer: Self-pay | Admitting: Pulmonary Disease

## 2011-08-29 ENCOUNTER — Telehealth: Payer: Self-pay | Admitting: *Deleted

## 2011-08-29 NOTE — Telephone Encounter (Signed)
Refill request for Ambien 10mg . With 5 refills. Last refilled on 8.5.12. Pt has not been seen within a year. OK to refill?

## 2011-08-30 MED ORDER — ZOLPIDEM TARTRATE 10 MG PO TABS: 10.0000 mg | ORAL_TABLET | Freq: Every evening | ORAL | Status: DC | PRN

## 2011-08-30 NOTE — Telephone Encounter (Signed)
Ok for 1 month with 1 refill. Needs OV-CPX, cannot find any notes since 2008 for IM, before any additional refills.

## 2011-08-30 NOTE — Telephone Encounter (Signed)
Phoned in. & notified pt he must schedule an OV for further refills.

## 2011-11-09 ENCOUNTER — Other Ambulatory Visit (INDEPENDENT_AMBULATORY_CARE_PROVIDER_SITE_OTHER): Payer: 59

## 2011-11-09 ENCOUNTER — Ambulatory Visit (INDEPENDENT_AMBULATORY_CARE_PROVIDER_SITE_OTHER): Payer: 59 | Admitting: Internal Medicine

## 2011-11-09 ENCOUNTER — Encounter: Payer: Self-pay | Admitting: Internal Medicine

## 2011-11-09 VITALS — BP 110/82 | HR 55 | Temp 97.6°F | Resp 16 | Ht 72.25 in | Wt 226.5 lb

## 2011-11-09 DIAGNOSIS — G4700 Insomnia, unspecified: Secondary | ICD-10-CM

## 2011-11-09 DIAGNOSIS — Z0000 Encounter for general adult medical examination without abnormal findings: Secondary | ICD-10-CM

## 2011-11-09 DIAGNOSIS — Z136 Encounter for screening for cardiovascular disorders: Secondary | ICD-10-CM

## 2011-11-09 DIAGNOSIS — E785 Hyperlipidemia, unspecified: Secondary | ICD-10-CM

## 2011-11-09 DIAGNOSIS — F528 Other sexual dysfunction not due to a substance or known physiological condition: Secondary | ICD-10-CM

## 2011-11-09 LAB — COMPREHENSIVE METABOLIC PANEL
BUN: 14 mg/dL (ref 6–23)
CO2: 29 mEq/L (ref 19–32)
Creatinine, Ser: 1 mg/dL (ref 0.4–1.5)
GFR: 82.58 mL/min (ref >60.00)
Glucose, Bld: 106 mg/dL — ABNORMAL HIGH (ref 70–99)
Total Bilirubin: 0.6 mg/dL (ref 0.3–1.2)

## 2011-11-09 LAB — LDL CHOLESTEROL, DIRECT: Direct LDL: 162.9 mg/dL

## 2011-11-09 LAB — LIPID PANEL
Cholesterol: 223 mg/dL — ABNORMAL HIGH (ref 0–200)
Triglycerides: 127 mg/dL (ref 0.0–149.0)

## 2011-11-09 MED ORDER — ZOLPIDEM TARTRATE 10 MG PO TABS: 10.0000 mg | ORAL_TABLET | Freq: Every evening | ORAL | Status: DC | PRN

## 2011-11-09 NOTE — Progress Notes (Signed)
Subjective:    Patient ID: Larry Rivera, male    DOB: May 19, 1966, 46 y.o.   MRN: [id number]  HPI Mr. Wiedeman presents for a routine medical exam. He is feeling well with no major illness, surgery, injury.  Past Medical History  Diagnosis Date  . Psychosexual dysfunction with inhibited sexual excitement   . Other and unspecified hyperlipidemia   . Paroxysmal ventricular tachycardia   . Insomnia, unspecified   . Allergic rhinitis due to pollen   . Unspecified part of closed fracture of clavicle    Past Surgical History  Procedure Date  . Cystectomy     Back of neck  . Rhinoplasty     Deviated septum   Family History  Problem Relation Age of Onset  . Lung disease Maternal Grandmother   . Stroke Maternal Grandmother   . Rheum arthritis Mother   . Diabetes Mother     neuropathy  . Hyperlipidemia Father   . Diabetes Sister   . Cancer Neg Hx   . Heart disease Neg Hx    History   Social History  . Marital Status: Married    Spouse Name: N/A    Number of Children: 1  . Years of Education: 12   Occupational History  . 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 Smoking status: Current Everyday Smoker    Types: Cigars  . Smokeless tobacco: Never Used   Comment: Cigar Only: not every day.  . Alcohol Use: Yes     rare drink  . Drug Use: No  . Sexually Active: Yes -- Male partner(s)   Other Topics Concern  . Not on file   Social History Narrative   [employer]. Married '91. 1 dtr '95. Work - Naval architect - long haul.       Review of Systems Constitutional:  Negative for fever, chills, activity change and unexpected weight change.  HEENT:  Negative for hearing loss, ear pain, congestion, neck stiffness and postnasal drip. Negative for sore throat or swallowing problems. Negative for dental complaints.   Eyes: Negative for vision loss or change in visual acuity.  Respiratory: Negative for chest tightness and wheezing. Negative for DOE.   Cardiovascular:  Negative for chest pain or palpitations. No decreased exercise tolerance Gastrointestinal: No change in bowel habit. No bloating or gas. No reflux or indigestion Genitourinary: Negative for urgency, frequency, flank pain and difficulty urinating.  Musculoskeletal: Negative for myalgias, back pain, arthralgias and gait problem.  Neurological: Negative for dizziness, tremors, weakness and headaches.  Hematological: Negative for adenopathy.  Psychiatric/Behavioral: Negative for behavioral problems and dysphoric mood.       Objective:   Physical Exam Filed Vitals:   11/09/11 0906  BP: 110/82  Pulse: 55  Temp: 97.6 F (36.4 C)  Resp: 16  Weight: 226 lb 8 oz (102.74 kg)   Gen'l: Well nourished well developed white male in no acute distress  HEENT: Head: Normocephalic and atraumatic. Right Ear: External ear normal. EAC/TM nl. Left Ear: External ear normal.  EAC/TM nl. Nose: Nose normal. Mouth/Throat: Oropharynx is clear and moist. Dentition - native, in good repair. No buccal or palatal lesions. Posterior pharynx clear. Eyes: Conjunctivae and sclera clear. EOM intact. Pupils are equal, round, and reactive to light. Right eye exhibits no discharge. Left eye exhibits no discharge. Neck: Normal range of motion. Neck supple. No JVD present. No tracheal deviation present. No thyromegaly present.  Cardiovascular: Normal rate, regular rhythm, no friction rub. S3 most  prominent at LSB, diastolic murmur noted, short runs of regular tachycardia (3-4 beats).      Quiet precordium. 2+ radial and DP pulses . No carotid bruits Pulmonary/Chest: Effort normal. No respiratory distress or increased WOB, no wheezes, no rales. No chest wall deformity or CVAT. Abdominal: Soft. Bowel sounds are normal in all quadrants. He exhibits no distension, no tenderness, no rebound or guarding, No heptosplenomegaly  Genitourinary:  deferred Musculoskeletal: Normal range of motion. He exhibits no edema and no tenderness.        Small and large joints without redness, synovial thickening or deformity. Full range of motion preserved about all small, median and large joints.  Lymphadenopathy:    He has no cervical or supraclavicular adenopathy.  Neurological: He is alert and oriented to person, place, and time. CN II-XII intact. DTRs 2+ and symmetrical biceps, radial and patellar tendons. Cerebellar function normal with no tremor, rigidity, normal gait and station.  Skin: Skin is warm and dry. No rash noted. No erythema.  Psychiatric: He has a normal mood and affect. His behavior is normal. Thought content normal.   12 lead EKG and rhythm strip: PVC's with compensatory pause, bigeminy  Lab Results  Component Value Date   WBC 9.4 03/16/2007   HGB 12.9* 03/16/2007   HCT 38.3* 03/16/2007   PLT 250 03/16/2007   GLUCOSE 106* 11/09/2011   CHOL 223* 11/09/2011   TRIG 127.0 11/09/2011   HDL 44.40 11/09/2011   LDLDIRECT 162.9 11/09/2011   ALT 30 11/09/2011   AST 20 11/09/2011   NA 139 11/09/2011   K 4.3 11/09/2011   CL 104 11/09/2011   CREATININE 1.0 11/09/2011   BUN 14 11/09/2011   CO2 29 11/09/2011   TSH 1.150  03/14/2007   INR 1.0 03/14/2007          Assessment & Plan:

## 2011-11-11 DIAGNOSIS — Z0000 Encounter for general adult medical examination without abnormal findings: Secondary | ICD-10-CM | POA: Insufficient documentation

## 2011-11-11 NOTE — Assessment & Plan Note (Signed)
Reviewed principles of sleep hygiene:  Sleep is a learned or unlearned behavior. 5 principles of sleep hygiene - 1) regular hour to retire and rise 7days/wk 2) no stimulants - caffeine, chocolat, alcohol, 3) regular exercise  - every afternoon  4) sleep sanctuary - a space that is right light, temperature, sound level, good bed where all you do is sleep. 5) No extinction behaviors, e.g. Laying in bed awake doing anything but sleeping. This means if you have a bad night - no naps, etc  Renewed Rx for zolpidem 10 mg qhs prn.

## 2011-11-11 NOTE — Assessment & Plan Note (Signed)
LDL greater than treatment threshold of 160.   Plan - attempt at life-style management with low fat diet and aerobic exercise at least 3 days a week. Repeat lab in 3 months - if LDL remains much above 130 will start medical therapy.

## 2011-11-11 NOTE — Assessment & Plan Note (Signed)
Interval medical history is benign with biggest issues being insomnia and lipid management. Physical exam was notable for frequent PVCs on cardiac exam, otherwise normal. Lab results are in normal limits except for lipids. He is due for Tdap. He will return for lipid panel in 3 months after conscientiously reducing fat intake otherwise he will return as needed. Recommended general physical exam in 2-3 years.   In summary- a very nice man who is medically stable but with hyperlipidemia that needs to be addressed.

## 2011-11-11 NOTE — Assessment & Plan Note (Signed)
Trial of viagra - free sample card and Rx provided

## 2011-11-17 ENCOUNTER — Encounter: Payer: Self-pay | Admitting: *Deleted

## 2011-11-17 ENCOUNTER — Telehealth: Payer: Self-pay | Admitting: *Deleted

## 2011-11-17 NOTE — Telephone Encounter (Signed)
Copy of his 3/27 office note that includes all labs and recommendations was mailed 4/1. If he hasn't received in the mail we can send another copy.

## 2011-11-17 NOTE — Telephone Encounter (Signed)
Request for lab results

## 2011-11-18 NOTE — Telephone Encounter (Signed)
Called & LM on voice mail as instructed by pt's msge: Gave information on lipids/cholesterol results and MEN plan [return in [3] mths for repeat labs]. Left contact name & number for any other questions/concerns.

## 2012-05-04 ENCOUNTER — Other Ambulatory Visit: Payer: Self-pay | Admitting: *Deleted

## 2012-05-04 DIAGNOSIS — Z0000 Encounter for general adult medical examination without abnormal findings: Secondary | ICD-10-CM

## 2012-05-04 MED ORDER — ZOLPIDEM TARTRATE 10 MG PO TABS: 10.0000 mg | ORAL_TABLET | Freq: Every evening | ORAL | Status: DC | PRN

## 2012-05-04 NOTE — Telephone Encounter (Signed)
Rx PRINTED FOR DR. Debby Bud TO SIGN TO BE FAXED TO NEW PHARMAC[facility] OF [facility]

## 2012-11-20 ENCOUNTER — Other Ambulatory Visit: Payer: Self-pay

## 2012-11-20 DIAGNOSIS — Z0000 Encounter for general adult medical examination without abnormal findings: Secondary | ICD-10-CM

## 2012-11-20 MED ORDER — ZOLPIDEM TARTRATE 10 MG PO TABS: 10.0000 mg | ORAL_TABLET | Freq: Every evening | ORAL | Status: DC | PRN

## 2012-11-21 NOTE — Telephone Encounter (Signed)
Zolpidem called to pharmacy  

## 2012-12-25 ENCOUNTER — Other Ambulatory Visit: Payer: Self-pay

## 2012-12-25 NOTE — Telephone Encounter (Signed)
Received a fax from [facility] on [address] that pt's prescription for Zolpidem was not received on 11/21/12. I called and spoke to Bertram and gave her this information over the phone.

## 2013-03-06 ENCOUNTER — Telehealth: Payer: Self-pay | Admitting: Medical Oncology

## 2013-03-06 ENCOUNTER — Other Ambulatory Visit: Payer: 59 | Admitting: Lab

## 2013-03-06 NOTE — Telephone Encounter (Signed)
erroneous

## 2013-03-06 NOTE — Telephone Encounter (Deleted)
Wants to know if he can f/u with Dr Arbutus Ped today. OK per dr Arbutus Ped. I told pt to come in for labs at 4

## 2013-05-22 ENCOUNTER — Other Ambulatory Visit: Payer: Self-pay | Admitting: Internal Medicine

## 2013-06-16 ENCOUNTER — Other Ambulatory Visit: Payer: Self-pay | Admitting: Internal Medicine

## 2013-06-24 ENCOUNTER — Ambulatory Visit (INDEPENDENT_AMBULATORY_CARE_PROVIDER_SITE_OTHER): Payer: 59 | Admitting: Internal Medicine

## 2013-06-24 ENCOUNTER — Encounter: Payer: Self-pay | Admitting: Internal Medicine

## 2013-06-24 VITALS — BP 112/72 | HR 44 | Temp 97.5°F | Wt 231.0 lb

## 2013-06-24 DIAGNOSIS — R030 Elevated blood-pressure reading, without diagnosis of hypertension: Secondary | ICD-10-CM

## 2013-06-24 DIAGNOSIS — E785 Hyperlipidemia, unspecified: Secondary | ICD-10-CM

## 2013-06-24 NOTE — Patient Instructions (Signed)
Your blood pressures have been great including today. The way to bring it down is to be sure you are relaxed.   Vitals - 1 value per visit 06/24/2013 11/09/2011 10/06/2009 11/04/2008  SYSTOLIC 112 110 161 122  DIASTOLIC 72 82 72 74   Vitals - 1 value per visit 07/03/2008 06/05/2008 11/14/2007  SYSTOLIC 118 110 096  DIASTOLIC 68 76 68    Please return for recheck of your cholesterol. The results of lab will be posted to MyChart.

## 2013-06-24 NOTE — Assessment & Plan Note (Signed)
Taking no medication. He has been working on low fat diet  Plan F/u lipid panel with recommendations to follow.

## 2013-06-24 NOTE — Progress Notes (Signed)
Pre visit review using our clinic review tool, if applicable. No additional management support is needed unless otherwise documented below in the visit note. 

## 2013-06-24 NOTE — Progress Notes (Signed)
  Subjective:    Patient ID: Larry Rivera, male    DOB: [birth year], 47 y.o.   MRN: [id number]  HPI Patient presents for concern for BP: he has not had a previous problem: Vitals - 1 value per visit 06/24/2013 11/09/2011 10/06/2009 11/04/2008  SYSTOLIC 112 110 469 122  DIASTOLIC 72 82 72 74   Vitals - 1 value per visit 07/03/2008 06/05/2008 11/14/2007  SYSTOLIC 118 110 629  DIASTOLIC 68 76 68   He has been asymptomatic.He did have elevated BP at [facility]  In March LDL was 162.5. He was to work on life-style management but he has not been back for follow up lab.  PMH, FamHx and SocHx reviewed for any changes and relevance.  Current Outpatient Prescriptions on File Prior to Visit  Medication Sig Dispense Refill  . OVER THE COUNTER MEDICATION Take by mouth daily as needed. Equate Vegetable Laxative.      . zolpidem (AMBIEN) 10 MG tablet Take 1 tablet (10 mg total) by mouth at bedtime as needed.  30 tablet  5   No current facility-administered medications on file prior to visit.      Review of Systems System review is negative for any constitutional, cardiac, pulmonary, GI or neuro symptoms or complaints other than as described in the HPI.     Objective:   Physical Exam Filed Vitals:   06/24/13 0941  BP: 112/72  Pulse: 44  Temp: 97.5 F (36.4 C)   BP Readings from Last 3 Encounters:  06/24/13 112/72  11/09/11 110/82  10/06/09 114/72   Gen'l- WNWD man in no distress Cor 2+ radial, RRR Neuro - normal       Assessment & Plan:  Elevated BP - record here reveals normal BPs. He did have a couple of elevations at [facility].  Plan No medical intervention indicated at this time.

## 2013-07-30 ENCOUNTER — Ambulatory Visit (INDEPENDENT_AMBULATORY_CARE_PROVIDER_SITE_OTHER): Payer: 59 | Admitting: Internal Medicine

## 2013-07-30 ENCOUNTER — Encounter: Payer: Self-pay | Admitting: Internal Medicine

## 2013-07-30 VITALS — BP 124/68 | HR 43 | Temp 98.4°F | Wt 225.8 lb

## 2013-07-30 DIAGNOSIS — I493 Ventricular premature depolarization: Secondary | ICD-10-CM

## 2013-07-30 DIAGNOSIS — I499 Cardiac arrhythmia, unspecified: Secondary | ICD-10-CM

## 2013-07-30 DIAGNOSIS — I4949 Other premature depolarization: Secondary | ICD-10-CM

## 2013-07-30 NOTE — Progress Notes (Signed)
Subjective:    Patient ID: Larry Rivera, male    DOB: [birth year], 47 y.o.   MRN: [id number]  HPI Larry Rivera presents for evaluation of reported irregular heart rate. 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 He has had no symptoms: no syncope or near-syncope, no palpations, no shortness of breath and no chest pain.   Cardiac history: Cath 2008 - low normal EF. No obstructive disease  Cardiac MRI 2008 : IMPRESSION:  1. Normal cardiac chamber sizes.  2. Normal left ventricle with no regional wall motion abnormalities. EF 58%.  3. No evidence of RV dysplasia or fatty infiltration of the right ventricular free wall.  12 Lead EKG  November 09, 2011 - PVCs with short run of bigeminy  Past Medical History  Diagnosis Date  . Psychosexual dysfunction with inhibited sexual excitement   . Other and unspecified hyperlipidemia   . Paroxysmal ventricular tachycardia   . Insomnia, unspecified   . Allergic rhinitis due to pollen   . Unspecified part of closed fracture of clavicle    Past Surgical History  Procedure Laterality Date  . Cystectomy      Back of neck  . Rhinoplasty      Deviated septum   Family History  Problem Relation Age of Onset  . Lung disease Maternal Grandmother   . Stroke Maternal Grandmother   . Rheum arthritis Mother   . Diabetes Mother     neuropathy  . Hyperlipidemia Father   . Diabetes Sister   . Cancer Neg Hx   . Heart disease Neg Hx    History   Social History  . Marital Status: Married    Spouse Name: N/A    Number of Children: 1  . Years of Education: 12   Occupational History  . 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 Smoking status: Current Every Day Smoker    Types: Cigars  . Smokeless tobacco: Never Used     Comment: Cigar Only: not every day.  . Alcohol Use: Yes     Comment: rare drink  . Drug Use: No  . Sexual  Activity: Yes    Partners: Female   Other Topics Concern  . Not on file   Social History Narrative   HSG. Married '91. 1 dtr '95. Work - Naval architect - long haul.    Current Outpatient Prescriptions on File Prior to Visit  Medication Sig Dispense Refill  . OVER THE COUNTER MEDICATION Take by mouth daily as needed. Equate Vegetable Laxative.      . zolpidem (AMBIEN) 10 MG tablet Take 1 tablet (10 mg total) by mouth at bedtime as needed.  30 tablet  5   No current facility-administered medications on file prior to visit.    Review of Systems System review is negative for any constitutional, cardiac, pulmonary, GI or neuro symptoms or complaints other than as described in the HPI.     Objective:   Physical Exam Filed Vitals:   07/30/13 1623  BP: 124/68  Pulse: 43  Temp: 98.4 F (36.9 C)   BP Readings from Last 3 Encounters:  07/30/13 124/68  06/24/13 112/72  11/09/11 110/82   Gen'l- WNWD man in no distress Cor - 2+ radial pulse, quiet precordium. Frequent PVCs with pause.  12 lead EKG with Sinus rhythm, frequent PVCs  Assessment & Plan:

## 2013-07-30 NOTE — Progress Notes (Signed)
Pre visit review using our clinic review tool, if applicable. No additional management support is needed unless otherwise documented below in the visit note. 

## 2013-07-30 NOTE — Patient Instructions (Signed)
Happy Holidays to you and your family  On reviewing your chart you had an evaluation for exercised induced tachycardia (rapid heart rate) in 2008 along with frequent premature ventricular contractions (heart beats). Your evaluation was complete including a cardiac catherization which reveal the absence of any coronary obstruction (blockage) and normal heart function. You had a cardiac MRI in 2008 that was a normal study. In March  2013 you had an EKG that demonstrated continued, benign, premature ventricular contractions in a bigeminy pattern (every other beat).  Today you have an exam that is normal except for premature ventricular contractions. A repeat EKG reveals normal sinus rhythm with premature ventricular contractions in a trigeminy pattern (every third beat).  These findings of premature beats are benign and pose no risk to you. You have had an evaluation that looked for and ruled out any underlying structural heart disease or coronary disease that would affect your conduction system.   From a medical perspective your are safe for driving with no cardiac risk.

## 2013-08-03 DIAGNOSIS — I493 Ventricular premature depolarization: Secondary | ICD-10-CM | POA: Insufficient documentation

## 2013-08-03 NOTE — Assessment & Plan Note (Signed)
At [facility] exam by NP at urgent care he was noted to have irregular heart rhythm and presented for clearance. Record reviewed - h/o asymptomatic PVCs with normal cardiac workup. 12 Lead EKG with PVCs  Plan Provided letter of clearance for DMV

## 2013-10-31 ENCOUNTER — Other Ambulatory Visit: Payer: Self-pay

## 2013-10-31 DIAGNOSIS — Z0000 Encounter for general adult medical examination without abnormal findings: Secondary | ICD-10-CM

## 2013-11-01 MED ORDER — ZOLPIDEM TARTRATE 10 MG PO TABS: 10.0000 mg | ORAL_TABLET | Freq: Every evening | ORAL | Status: DC | PRN

## 2014-03-17 ENCOUNTER — Ambulatory Visit (INDEPENDENT_AMBULATORY_CARE_PROVIDER_SITE_OTHER): Payer: 59 | Admitting: Internal Medicine

## 2014-03-17 ENCOUNTER — Telehealth: Payer: Self-pay | Admitting: Internal Medicine

## 2014-03-17 ENCOUNTER — Encounter: Payer: Self-pay | Admitting: Internal Medicine

## 2014-03-17 VITALS — BP 140/74 | HR 60 | Temp 98.5°F | Resp 16 | Wt 237.0 lb

## 2014-03-17 DIAGNOSIS — R030 Elevated blood-pressure reading, without diagnosis of hypertension: Secondary | ICD-10-CM

## 2014-03-17 DIAGNOSIS — M7030 Other bursitis of elbow, unspecified elbow: Secondary | ICD-10-CM | POA: Insufficient documentation

## 2014-03-17 DIAGNOSIS — M7021 Olecranon bursitis, right elbow: Secondary | ICD-10-CM

## 2014-03-17 DIAGNOSIS — IMO0001 Reserved for inherently not codable concepts without codable children: Secondary | ICD-10-CM

## 2014-03-17 DIAGNOSIS — M7020 Olecranon bursitis, unspecified elbow: Secondary | ICD-10-CM

## 2014-03-17 MED ORDER — NAPROXEN 500 MG PO TABS: 500.0000 mg | ORAL_TABLET | Freq: Two times a day (BID) | ORAL | Status: DC

## 2014-03-17 MED ORDER — VITAMIN D 1000 UNITS PO TABS: 1000.0000 [IU] | ORAL_TABLET | Freq: Every day | ORAL | Status: DC

## 2014-03-17 MED ORDER — METHYLPREDNISOLONE ACETATE 40 MG/ML IJ SUSP
20.0000 mg | Freq: Once | INTRAMUSCULAR | Status: AC
  Administered 2014-03-17: 20 mg via INTRA_ARTICULAR

## 2014-03-17 NOTE — Telephone Encounter (Signed)
Relevant patient education assigned to patient using Emmi. ° °

## 2014-03-17 NOTE — Progress Notes (Signed)
   Subjective:    HPI  C/o R elbow swelling and pain x 1.5 mo He went to [facility] - had it drained and cultured. He had abx for rash. 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 Negative for appetite change and fatigue.  HENT: Negative for congestion, nosebleeds, sneezing, sore throat and trouble swallowing.   Eyes: Negative for itching and visual disturbance.  Respiratory: Negative for cough.   Cardiovascular: Negative for chest pain, palpitations and leg swelling.  Gastrointestinal: Negative for nausea, diarrhea, blood in stool and abdominal distention.  Genitourinary: Negative for frequency and hematuria.  Musculoskeletal: Positive for joint swelling. Negative for back pain, gait problem and neck pain.  Skin: Negative for rash.  Neurological: Negative for dizziness, tremors, speech difficulty and weakness.  Psychiatric/Behavioral: Negative for sleep disturbance, dysphoric mood and agitation. The patient is not nervous/anxious.        Objective:   Physical Exam  Musculoskeletal: He exhibits no tenderness.  R elbow bursa is swollen  Skin: No erythema.     Options to treat were discussed. She elected aspiration/steroids   Procedure Note :    Procedure :Elbow  bursa aspiration and steroid injection   Indication: Bursitis with refractory  chronic pain.   Risks including unsuccessful procedure , bleeding, infection, bruising, skin atrophy and others were explained to the patient in detail as well as the benefits. Informed consent was obtained and signed.   Tthe patient was placed in a comfortable position. Skin was prepped with Betadine and alcohol  and anesthetized with a cooling spray. Then, a 3 cc syringe with a 1 inch long 25-gauge needle was used for a skin over bursa injection 1 mL of 2% lidocaine. 21 gauge 1 inch needle on a 10 ml  syringe was used to aspirate 5 ml of reddish sero-sanguinous fluid (discarded). Then, 20 mg of Depo-Medrol and 1 cc Lido was injected in the bursa .  Band-Aid was applied.   Tolerated well. Complications: None. Good pain relief following the procedure.   Postprocedure instructions :    A Band-Aid should be left on for 12 hours. Injection therapy is not a cure itself. It is used in conjunction with other modalities. You can use nonsteroidal anti-inflammatories like ibuprofen , hot and cold compresses. Rest is recommended in the next 24 hours. You need to report immediately  if fever, chills or any signs of infection develop.      Assessment & Plan:

## 2014-03-17 NOTE — Assessment & Plan Note (Addendum)
7/15 R Will drain/inject Naproxen qd pc  Potential benefits of a short term NSAID use as well as potential risks  and complications were explained to the patient and were aknowledged.

## 2014-03-17 NOTE — Progress Notes (Signed)
Pre visit review using our clinic review tool, if applicable. No additional management support is needed unless otherwise documented below in the visit note. 

## 2014-03-17 NOTE — Patient Instructions (Addendum)
Postprocedure instructions :    A Band-Aid should be left on for 12 hours. Injection therapy is not a cure itself. It is used in conjunction with other modalities. You can use nonsteroidal anti-inflammatories like ibuprofen , hot and cold compresses. Rest is recommended in the next 24 hours. You need to report immediately  if fever, chills or any signs of infection develop.  Do not use your elbow rest  Reduce work hours!

## 2014-03-17 NOTE — Assessment & Plan Note (Signed)
Reduce salt Loose wt Work less

## 2014-03-25 ENCOUNTER — Other Ambulatory Visit: Payer: Self-pay | Admitting: *Deleted

## 2014-03-25 MED ORDER — ZOLPIDEM TARTRATE 10 MG PO TABS: 10.0000 mg | ORAL_TABLET | Freq: Every evening | ORAL | Status: DC | PRN

## 2014-03-25 NOTE — Telephone Encounter (Signed)
Faxed script back to [facility].../lmb 

## 2014-03-25 NOTE — Telephone Encounter (Signed)
Noted Refill ok

## 2014-03-25 NOTE — Telephone Encounter (Signed)
Pt has new pt appt set up with Dr. Dorise Hiss in November, but needing refill on his zolpidem is this ok...Raechel Chute

## 2014-04-21 ENCOUNTER — Other Ambulatory Visit: Payer: Self-pay | Admitting: Internal Medicine

## 2014-05-05 ENCOUNTER — Encounter: Payer: Self-pay | Admitting: Cardiology

## 2014-05-05 ENCOUNTER — Ambulatory Visit (INDEPENDENT_AMBULATORY_CARE_PROVIDER_SITE_OTHER): Payer: 59 | Admitting: Cardiology

## 2014-05-05 VITALS — BP 122/64 | HR 89 | Ht 73.0 in | Wt 239.8 lb

## 2014-05-05 DIAGNOSIS — I493 Ventricular premature depolarization: Secondary | ICD-10-CM

## 2014-05-05 DIAGNOSIS — I4949 Other premature depolarization: Secondary | ICD-10-CM

## 2014-05-05 DIAGNOSIS — E785 Hyperlipidemia, unspecified: Secondary | ICD-10-CM

## 2014-05-05 DIAGNOSIS — IMO0001 Reserved for inherently not codable concepts without codable children: Secondary | ICD-10-CM

## 2014-05-05 DIAGNOSIS — R030 Elevated blood-pressure reading, without diagnosis of hypertension: Secondary | ICD-10-CM

## 2014-05-05 LAB — COMPREHENSIVE METABOLIC PANEL
ALT: 28 U/L (ref 0–53)
AST: 21 U/L (ref 0–37)
Albumin: 4 g/dL (ref 3.5–5.2)
Alkaline Phosphatase: 77 U/L (ref 39–117)
BUN: 13 mg/dL (ref 6–23)
CO2: 30 mEq/L (ref 19–32)
Calcium: 9.3 mg/dL (ref 8.4–10.5)
Chloride: 106 mEq/L (ref 96–112)
Creatinine, Ser: 1 mg/dL (ref 0.4–1.5)
GFR: 80.8 mL/min (ref >60.00)
Glucose, Bld: 105 mg/dL — ABNORMAL HIGH (ref 70–99)
Potassium: 4.1 mEq/L (ref 3.5–5.1)
Sodium: 141 mEq/L (ref 135–145)
Total Bilirubin: 0.5 mg/dL (ref 0.2–1.2)
Total Protein: 7.4 g/dL (ref 6.0–8.3)

## 2014-05-05 LAB — CBC WITH DIFFERENTIAL/PLATELET
Basophils Absolute: 0 10*3/uL (ref 0.0–0.1)
Basophils Relative: 0.5 % (ref 0.0–3.0)
Eosinophils Absolute: 0.1 10*3/uL (ref 0.0–0.7)
Eosinophils Relative: 1.3 % (ref 0.0–5.0)
HCT: 44.8 % (ref 39.0–52.0)
Hemoglobin: 15.2 g/dL (ref 13.0–17.0)
Lymphocytes Relative: 23.3 % (ref 12.0–46.0)
Lymphs Abs: 2.2 10*3/uL (ref 0.7–4.0)
MCHC: 33.9 g/dL (ref 30.0–36.0)
MCV: 93 fl (ref 78.0–100.0)
Monocytes Absolute: 0.6 10*3/uL (ref 0.1–1.0)
Monocytes Relative: 6.5 % (ref 3.0–12.0)
Neutro Abs: 6.5 10*3/uL (ref 1.4–7.7)
Neutrophils Relative %: 68.4 % (ref 43.0–77.0)
Platelets: 294 10*3/uL (ref 150.0–400.0)
RBC: 4.82 Mil/uL (ref 4.22–5.81)
RDW: 14.1 % (ref 11.5–15.5)
WBC: 9.4 10*3/uL (ref 4.0–10.5)

## 2014-05-05 LAB — LIPID PANEL
Cholesterol: 262 mg/dL — ABNORMAL HIGH (ref 0–200)
HDL: 34.8 mg/dL — ABNORMAL LOW (ref >39.00)
NonHDL: 227.2
Total CHOL/HDL Ratio: 8
Triglycerides: 206 mg/dL — ABNORMAL HIGH (ref 0.0–149.0)
VLDL: 41.2 mg/dL — ABNORMAL HIGH (ref 0.0–40.0)

## 2014-05-05 LAB — LDL CHOLESTEROL, DIRECT: Direct LDL: 211.1 mg/dL

## 2014-05-05 LAB — CALCIUM: Calcium: 9.3 mg/dL (ref 8.4–10.5)

## 2014-05-05 LAB — MAGNESIUM: Magnesium: 2 mg/dL (ref 1.5–2.5)

## 2014-05-05 MED ORDER — METOPROLOL TARTRATE 25 MG PO TABS: 25.0000 mg | ORAL_TABLET | Freq: Two times a day (BID) | ORAL | Status: DC

## 2014-05-05 NOTE — Patient Instructions (Signed)
Your physician has recommended you make the following change in your medication:   START TAKING METOPROLOL 25 MG TWICE DAILY   Your physician recommends that you return for lab work in: TODAY (CBC W DIFF, TSH, LIPIDS, CMET, CALCIUM, MAGNESIUM)   Your physician has requested that you have an echocardiogram. Echocardiography is a painless test that uses sound waves to create images of your heart. It provides your doctor with information about the size and shape of your heart and how well your heart's chambers and valves are working. This procedure takes approximately one hour. There are no restrictions for this procedure.  Your physician has recommended that you wear a 24 holter monitor. Holter monitors are medical devices that record the heart's electrical activity. Doctors most often use these monitors to diagnose arrhythmias. Arrhythmias are problems with the speed or rhythm of the heartbeat. The monitor is a small, portable device. You can wear one while you do your normal daily activities. This is usually used to diagnose what is causing palpitations/syncope (passing out).  Your physician recommends that you schedule a follow-up appointment in: 6 WEEKS WITH DR Delton See

## 2014-05-05 NOTE — Progress Notes (Signed)
Patient ID: Larry Rivera, male   DOB: 1965-11-03, 48 y.o.   MRN: [id number]    Patient Name: Larry Rivera Date of Encounter: 05/05/2014  Primary Care Provider:  Adella Hare, MD Primary Cardiologist: Dorothy Spark  Problem List   Past Medical History  Diagnosis Date  . Psychosexual dysfunction with inhibited sexual excitement   . Other and unspecified hyperlipidemia   . Paroxysmal ventricular tachycardia   . Insomnia, unspecified   . Allergic rhinitis due to pollen   . Unspecified part of closed fracture of clavicle    Past Surgical History  Procedure Laterality Date  . Cystectomy      Back of neck  . Rhinoplasty      Deviated septum    Allergies  Allergies  Allergen Reactions  . Penicillins     HPI  A very pleasant 48 year old overweight gentleman who is coming with concerns of frequent PVCs.  The patient was first diagnosed about 6 years ago. 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 His PVCs were discovered during annual physical for his work. He denies any palpitations, chest pain or resting SOB. He has gained weight recently and noticed some dyspnea on moderate exertion. No h/o syncope. No h/o SCD or MI in the family. He is an ongoing cigar smoker.  Home Medications  Prior to Admission medications   Medication Sig Start Date End Date Taking? Authorizing Provider  cholecalciferol (VITAMIN D) 1000 UNITS tablet Take 1 tablet (1,000 Units total) by mouth daily. 03/17/14 03/17/15 Yes Aleksei Plotnikov V, MD  naproxen (NAPROSYN) 500 MG tablet TAKE 1 TABLET BY MOUTH TWICE A DAY WITH A MEAL 04/22/14  Yes Aleksei Plotnikov V, MD  OVER THE COUNTER MEDICATION Take by mouth daily as needed. Equate Vegetable Laxative.   Yes Historical Provider, MD  zolpidem (AMBIEN) 10 MG tablet Take 1 tablet (10 mg total) by mouth at bedtime as needed for sleep. 03/25/14  Yes Rowe Clack, MD    Family History  Family History  Problem Relation Age of Onset  . Lung  disease Maternal Grandmother   . Stroke Maternal Grandmother   . Rheum arthritis Mother   . Diabetes Mother     neuropathy  . Hyperlipidemia Father   . Diabetes Sister   . Cancer Neg Hx   . Heart disease Neg Hx     Social History  History   Social History  . Marital Status: Married    Spouse Name: N/A    Number of Children: 1  . Years of Education: 12   Occupational History  . 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 Smoking status: Current Every Day Smoker    Types: Cigars  . Smokeless tobacco: Never Used     Comment: Cigar Only: not every day.  . Alcohol Use: Yes     Comment: rare drink  . Drug Use: No  . Sexual Activity: Yes    Partners: Female   Other Topics Concern  . Not on file   Social History Narrative   HSG. Married '91. 1 dtr '95. 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     Review of Systems, as per HPI, otherwise negative General:  No chills, fever, night sweats or weight changes.  Cardiovascular:  No chest pain, dyspnea on exertion, edema, orthopnea, palpitations, paroxysmal nocturnal dyspnea. Dermatological: No rash, lesions/masses Respiratory: No cough, dyspnea Urologic: No hematuria, dysuria Abdominal:  No nausea, vomiting, diarrhea, bright red blood per rectum, melena, or hematemesis Neurologic:  No visual changes, wkns, changes in mental status. All other systems reviewed and are otherwise negative except as noted above.  Physical Exam  Blood pressure 122/64, pulse 89, height '6\' 1"'  (8.466 m), weight 239 lb 12.8 oz (108.773 kg).  General: Pleasant, NAD Psych: Normal affect. Neuro: Alert and oriented X 3. Moves all extremities spontaneously. HEENT: Normal  Neck: Supple without bruits or JVD. Lungs:  Resp regular and unlabored, CTA. Heart: RRR no s3, s4, or murmurs. Abdomen: Soft, non-tender, non-distended, BS + x 4.  Extremities: No clubbing, cyanosis or edema. DP/PT/Radials 2+ and equal bilaterally.  Labs:  No results  found for this basename: CKTOTAL, CKMB, TROPONINI,  in the last 72 hours Lab Results  Component Value Date   WBC 9.4 03/16/2007   HGB 12.9* 03/16/2007   HCT 38.3* 03/16/2007   MCV 90.1 03/16/2007   PLT 250 03/16/2007       Component Value Date/Time   NA 139 11/09/2011 1040   K 4.3 11/09/2011 1040   CL 104 11/09/2011 1040   CO2 29 11/09/2011 1040   GLUCOSE 106* 11/09/2011 1040   BUN 14 11/09/2011 1040   CREATININE 1.0 11/09/2011 1040   CALCIUM 9.1 11/09/2011 1040   PROT 7.3 11/09/2011 1040   ALBUMIN 4.2 11/09/2011 1040   AST 20 11/09/2011 1040   ALT 30 11/09/2011 1040   ALKPHOS 61 11/09/2011 1040   BILITOT 0.6 11/09/2011 1040   GFRNONAA >60 03/16/2007 0528   GFRAA  Value: >60        The eGFR has been calculated using the MDRD equation. This calculation has not been validated in all clinical 03/16/2007 0528   Lab Results  Component Value Date   CHOL 223* 11/09/2011   HDL 44.40 11/09/2011   TRIG 127.0 11/09/2011    Accessory Clinical Findings  Echocardiogram - none  ECG - sinus rhythm with frequent monomorphic PVCs in a pattern of bigeminy and trigeminy, PVCs and appearing to originate in the RVOT.   Assessment & Plan   48 year old male  1. Frequent monomorphic PVCs - we will order a 48 Hour Holter to evaluate for PVC burden and evaluate for possible other arrhythmias. Order echo tu rule out structural heart disease. Check electrolytes including Mg and calcium.   2. BP - controlled  3. HLP - check today   Dorothy Spark, MD, [facility] 05/05/2014, 10:34 AM

## 2014-05-06 DIAGNOSIS — I493 Ventricular premature depolarization: Secondary | ICD-10-CM | POA: Insufficient documentation

## 2014-05-06 LAB — TSH: TSH: 1.36 u[IU]/mL (ref 0.35–4.50)

## 2014-05-07 ENCOUNTER — Telehealth: Payer: Self-pay | Admitting: *Deleted

## 2014-05-07 DIAGNOSIS — R030 Elevated blood-pressure reading, without diagnosis of hypertension: Secondary | ICD-10-CM

## 2014-05-07 DIAGNOSIS — IMO0001 Reserved for inherently not codable concepts without codable children: Secondary | ICD-10-CM

## 2014-05-07 DIAGNOSIS — E785 Hyperlipidemia, unspecified: Secondary | ICD-10-CM

## 2014-05-07 DIAGNOSIS — I493 Ventricular premature depolarization: Secondary | ICD-10-CM

## 2014-05-07 MED ORDER — PRAVASTATIN SODIUM 40 MG PO TABS: 40.0000 mg | ORAL_TABLET | Freq: Every evening | ORAL | Status: DC

## 2014-05-07 NOTE — Telephone Encounter (Signed)
Message copied by Loa Socks on Wed May 07, 2014  2:43 PM ------      Message from: Lars Masson      Created: Wed May 07, 2014  2:07 PM       Significantly elevated lipids, I would strongly advise to start atorvastatin 40 mg po daily and check CMP in 3 weeks. ------

## 2014-05-07 NOTE — Telephone Encounter (Signed)
Pt notified of significantly elevated lipids per Dr Delton See, and that he needs to start on atorvastatin 40 mg po daily and check CMP in 3 weeks.  Pt states he refuses to take atorvastatin d/t causing him extreme muscle aches in the past.  Informed Dr Delton See of this and her recommendation is for the pt to start taking Pravastatin 40 mg po daily.  Scheduled lab appt for 05/26/14 at pts request, to check CMP.  Pharmacy of choice confirmed with pt.  Pt verbalized understanding and agrees with this plan.

## 2014-05-12 ENCOUNTER — Encounter (INDEPENDENT_AMBULATORY_CARE_PROVIDER_SITE_OTHER): Payer: 59

## 2014-05-12 ENCOUNTER — Encounter: Payer: Self-pay | Admitting: *Deleted

## 2014-05-12 DIAGNOSIS — I493 Ventricular premature depolarization: Secondary | ICD-10-CM

## 2014-05-12 DIAGNOSIS — E785 Hyperlipidemia, unspecified: Secondary | ICD-10-CM

## 2014-05-12 DIAGNOSIS — I4949 Other premature depolarization: Secondary | ICD-10-CM

## 2014-05-12 DIAGNOSIS — R030 Elevated blood-pressure reading, without diagnosis of hypertension: Secondary | ICD-10-CM

## 2014-05-12 DIAGNOSIS — IMO0001 Reserved for inherently not codable concepts without codable children: Secondary | ICD-10-CM

## 2014-05-12 NOTE — Progress Notes (Signed)
Patient ID: Larry Rivera, male   DOB: [birth year], 48 y.o.   MRN: [id number] [facility] 24 hour holter monitor applied to patient.

## 2014-05-13 ENCOUNTER — Other Ambulatory Visit (HOSPITAL_COMMUNITY): Payer: 59

## 2014-05-16 ENCOUNTER — Telehealth: Payer: Self-pay | Admitting: *Deleted

## 2014-05-16 DIAGNOSIS — IMO0001 Reserved for inherently not codable concepts without codable children: Secondary | ICD-10-CM

## 2014-05-16 DIAGNOSIS — I493 Ventricular premature depolarization: Secondary | ICD-10-CM

## 2014-05-16 DIAGNOSIS — R030 Elevated blood-pressure reading, without diagnosis of hypertension: Secondary | ICD-10-CM

## 2014-05-16 NOTE — Telephone Encounter (Signed)
LMTCB in regards to pts 24 hr holter monitor showing extremely frequent multiform ventricular events and Dr Joanie Coddington recommendations for the pt to start taking metoprolol 25 mg BID and needing to be referred to EP.  Left message for pt to contact our office on Monday and ask for a triage nurse to go over results and recommendations.

## 2014-05-19 ENCOUNTER — Ambulatory Visit (HOSPITAL_COMMUNITY): Payer: 59 | Attending: Cardiology | Admitting: Radiology

## 2014-05-19 DIAGNOSIS — E785 Hyperlipidemia, unspecified: Secondary | ICD-10-CM | POA: Diagnosis not present

## 2014-05-19 DIAGNOSIS — I493 Ventricular premature depolarization: Secondary | ICD-10-CM | POA: Diagnosis present

## 2014-05-19 DIAGNOSIS — R030 Elevated blood-pressure reading, without diagnosis of hypertension: Secondary | ICD-10-CM

## 2014-05-19 DIAGNOSIS — IMO0001 Reserved for inherently not codable concepts without codable children: Secondary | ICD-10-CM

## 2014-05-19 MED ORDER — METOPROLOL TARTRATE 50 MG PO TABS: 50.0000 mg | ORAL_TABLET | Freq: Two times a day (BID) | ORAL | Status: DC

## 2014-05-19 NOTE — Telephone Encounter (Signed)
Pt scheduled to see Dr Ladona Ridgel on 05/21/14.

## 2014-05-19 NOTE — Telephone Encounter (Signed)
Discussed Metoprolol with Dr Delton See and she agreed the pt should increase to 50mg  twice a day.

## 2014-05-19 NOTE — Telephone Encounter (Signed)
LMTCB

## 2014-05-19 NOTE — Telephone Encounter (Signed)
I spoke with the pt and made him aware of monitor results.  The pt is already taking metoprolol tartrate 25mg  twice a day (since 05/05/14).  I instructed the pt to increase metoprolol to 50mg  twice a day and we will refer pt to EP.

## 2014-05-19 NOTE — Progress Notes (Signed)
Echocardiogram performed.  

## 2014-05-20 ENCOUNTER — Telehealth: Payer: Self-pay | Admitting: *Deleted

## 2014-05-20 DIAGNOSIS — R931 Abnormal findings on diagnostic imaging of heart and coronary circulation: Secondary | ICD-10-CM

## 2014-05-20 NOTE — Telephone Encounter (Signed)
Pt notified of abnormal echo-left ventricle being dilated with mildly depressed function per Dr Delton See.  Informed the pt that Dr Delton See recommends that the pt have a stress cardiac MRI to either be done for tomorrow 05/21/14 with Dr Delton See or for next Thursday 05/29/14 to be done by Dr Delton See.  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  Pt states that he can come on 05/29/14.  Pt states he does not have any metal in his body.  Pt states that he is not claustrophobic.  Informed pt that he should be NPO with no caffeine, soda, chocolate, tea, 12 hours prior to this test.  Sent this information to pre-cert and this was approved.  Informed the scheduler for stress cardiac MRI that pt needs to be set up for this on 05/29/14 with per-cert approved.  Scheduler [place] to contact pt today or tomorrow to have this scheduled and set up with Dr Delton See.  Pt aware that a scheduler will be contacting him to set this up.  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  Informed the pt that I will route this message to her and follow-up thereafter based on driving restrictions.  Pt verbalized understanding and agrees with this plan.

## 2014-05-20 NOTE — Telephone Encounter (Signed)
Message copied by Loa Socks on Tue May 20, 2014  3:23 PM ------      Message from: Lars Masson      Created: Tue May 20, 2014 12:23 PM       This patient has abnormal echo - Left ventricle is dilated with mildly depressed function. If he doesn't have any metal in his body we will schedule a stress cardiac MRI (he needs to be NPOfor that with no caffeine 12 hours prior). I could do it tomorrow in the afternoon or the next Thursday 10/15. Could you please arrange it?      Thank you,      Aris Lot ------

## 2014-05-21 ENCOUNTER — Ambulatory Visit (INDEPENDENT_AMBULATORY_CARE_PROVIDER_SITE_OTHER): Payer: 59 | Admitting: Internal Medicine

## 2014-05-21 ENCOUNTER — Encounter: Payer: Self-pay | Admitting: Internal Medicine

## 2014-05-21 ENCOUNTER — Encounter: Payer: Self-pay | Admitting: *Deleted

## 2014-05-21 VITALS — BP 112/58 | HR 87 | Ht 74.0 in | Wt 241.2 lb

## 2014-05-21 DIAGNOSIS — E785 Hyperlipidemia, unspecified: Secondary | ICD-10-CM

## 2014-05-21 DIAGNOSIS — I493 Ventricular premature depolarization: Secondary | ICD-10-CM

## 2014-05-21 NOTE — Assessment & Plan Note (Signed)
The patient's PVCs have become symptomatic, are severely dense, and associated with left ventricular dysfunction. There appears to be no improvement on beta blocker therapy. I've discussed the treatment options with the patient. The risk, goals, benefits, and expectations of catheter ablation have been discussed with the patient. He wishes to proceed with catheter ablation. We'll plan to use electroanatomic mapping to facilitate ablation.

## 2014-05-21 NOTE — Addendum Note (Signed)
Addended bySherri Rad C on: 05/21/2014 03:06 PM   Modules accepted: Orders

## 2014-05-21 NOTE — Progress Notes (Signed)
HPI [name] is referred today by Dr. Delton SeeNelson for evaluation of very frequent and dense ventricular ectopy. He is a very pleasant 48 year old man who developed ventricular tachycardia approximately 6 years ago during a stress test. Subsequent workup was unrevealing as he had preserved left ventricular function and no obstructive coronary artery disease. The patient has minimal palpitations and has not had syncope. He was found to have very dense ventricular ectopy an ECG and subsequently had a 24-hour Holter monitor demonstrating 47,000 PVCs in a 24-hour period. Additional evaluation with echocardiography demonstrates mild left ventricular dysfunction. Cardiac MRI scan is pending. He has had his ectopy despite beta blocker therapy. Allergies  Allergen Reactions  . Penicillins     Not sure, was told to not take .Marland Kitchen.     Current Outpatient Prescriptions  Medication Sig Dispense Refill  . cholecalciferol (VITAMIN D) 1000 UNITS tablet Take 1 tablet (1,000 Units total) by mouth daily.  100 tablet  3  . metoprolol tartrate (LOPRESSOR) 50 MG tablet Take 1 tablet (50 mg total) by mouth 2 (two) times daily.  60 tablet  3  . naproxen (NAPROSYN) 500 MG tablet TAKE 1 TABLET BY MOUTH TWICE A DAY WITH A MEAL  60 tablet  0  . OVER THE COUNTER MEDICATION Take by mouth daily as needed. Equate Vegetable Laxative.      . pravastatin (PRAVACHOL) 40 MG tablet Take 1 tablet (40 mg total) by mouth every evening.  90 tablet  4  . zolpidem (AMBIEN) 10 MG tablet Take 1 tablet (10 mg total) by mouth at bedtime as needed for sleep.  30 tablet  3   No current facility-administered medications for this visit.     Past Medical History  Diagnosis Date  . Psychosexual dysfunction with inhibited sexual excitement   . Other and unspecified hyperlipidemia   . Paroxysmal ventricular tachycardia   . Insomnia, unspecified   . Allergic rhinitis due to pollen   . Unspecified part of closed fracture of clavicle      ROS:   All systems reviewed and negative except as noted in the HPI.   Past Surgical History  Procedure Laterality Date  . Cystectomy      Back of neck  . Rhinoplasty      Deviated septum     Family History  Problem Relation Age of Onset  . Lung disease Maternal Grandmother   . Stroke Maternal Grandmother   . Rheum arthritis Mother   . Diabetes Mother     neuropathy  . Hyperlipidemia Father   . Diabetes Sister   . Cancer Neg Hx   . Heart disease Neg Hx      History   Social History  . Marital Status: Married    Spouse Name: N/A    Number of Children: 1  . Years of Education: 12   Occupational History  . 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 Smoking status: Current Every Day Smoker    Types: Cigars  . Smokeless tobacco: Never Used     Comment: Cigar Only: not every day.  . Alcohol Use: Yes     Comment: rare drink  . Drug Use: No  . Sexual Activity: Yes    Partners: Female   Other Topics Concern  . Not on file   Social History Narrative   HSG. Married '91. 1 dtr '95. 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     BP 112/58  Pulse 87  Ht 6\' 2"  (1.88 m)  Wt 241 lb 3.2 oz (109.408 kg)  BMI 30.96 kg/m2  Physical Exam:  Well appearing NAD HEENT: Unremarkable Neck:  6 cm JVD, no thyromegally Back:  No CVA tenderness Lungs:  Clear with no wheezes, rales, or rhonchi. HEART:  IRegular rate rhythm, no murmurs, no rubs, no clicks Abd:  soft, positive bowel sounds, no organomegally, no rebound, no guarding Ext:  2 plus pulses, no edema, no cyanosis, no clubbing Skin:  No rashes no nodules Neuro:  CN II through XII intact, motor grossly intact  EKG - normal sinus rhythm with PVCs in a bigeminal distribution  Assess/Plan:

## 2014-05-21 NOTE — Patient Instructions (Signed)
Your physician has recommended that you have an ablation. Catheter ablation is a medical procedure used to treat some cardiac arrhythmias (irregular heartbeats). During catheter ablation, a long, thin, flexible tube is put into a blood vessel in your groin (upper thigh), or neck. This tube is called an ablation catheter. It is then guided to your heart through the blood vessel. Radio frequency waves destroy small areas of heart tissue where abnormal heartbeats may cause an arrhythmia to start. Please see the instruction sheet given to you today.  Your physician recommends that you continue on your current medications as directed. Please refer to the Current Medication list given to you today.  

## 2014-05-22 ENCOUNTER — Other Ambulatory Visit: Payer: Self-pay | Admitting: *Deleted

## 2014-05-26 ENCOUNTER — Other Ambulatory Visit: Payer: 59

## 2014-06-04 ENCOUNTER — Encounter (HOSPITAL_COMMUNITY): Payer: Self-pay | Admitting: Pharmacy Technician

## 2014-06-10 ENCOUNTER — Other Ambulatory Visit (INDEPENDENT_AMBULATORY_CARE_PROVIDER_SITE_OTHER): Payer: 59

## 2014-06-10 DIAGNOSIS — E785 Hyperlipidemia, unspecified: Secondary | ICD-10-CM

## 2014-06-10 DIAGNOSIS — I493 Ventricular premature depolarization: Secondary | ICD-10-CM

## 2014-06-10 LAB — BASIC METABOLIC PANEL
BUN: 11 mg/dL (ref 6–23)
CHLORIDE: 104 meq/L (ref 96–112)
CO2: 19 mEq/L (ref 19–32)
Calcium: 8.9 mg/dL (ref 8.4–10.5)
Creatinine, Ser: 1.3 mg/dL (ref 0.4–1.5)
GFR: 60.28 mL/min (ref >60.00)
Glucose, Bld: 125 mg/dL — ABNORMAL HIGH (ref 70–99)
POTASSIUM: 4.6 meq/L (ref 3.5–5.1)
Sodium: 137 mEq/L (ref 135–145)

## 2014-06-10 LAB — CBC WITH DIFFERENTIAL/PLATELET
Basophils Absolute: 0.1 10*3/uL (ref 0.0–0.1)
Basophils Relative: 0.4 % (ref 0.0–3.0)
Eosinophils Absolute: 0.1 10*3/uL (ref 0.0–0.7)
Eosinophils Relative: 0.9 % (ref 0.0–5.0)
HCT: 46.3 % (ref 39.0–52.0)
Hemoglobin: 15.2 g/dL (ref 13.0–17.0)
Lymphocytes Relative: 27.9 % (ref 12.0–46.0)
Lymphs Abs: 3.3 10*3/uL (ref 0.7–4.0)
MCHC: 32.9 g/dL (ref 30.0–36.0)
MCV: 95 fl (ref 78.0–100.0)
Monocytes Absolute: 0.6 10*3/uL (ref 0.1–1.0)
Monocytes Relative: 5 % (ref 3.0–12.0)
Neutro Abs: 7.8 10*3/uL — ABNORMAL HIGH (ref 1.4–7.7)
Neutrophils Relative %: 65.8 % (ref 43.0–77.0)
Platelets: 269 10*3/uL (ref 150.0–400.0)
RBC: 4.88 Mil/uL (ref 4.22–5.81)
RDW: 14.3 % (ref 11.5–15.5)
WBC: 11.8 10*3/uL — ABNORMAL HIGH (ref 4.0–10.5)

## 2014-06-10 LAB — PROTIME-INR
INR: 0.9 ratio (ref 0.8–1.0)
Prothrombin Time: 10.5 s (ref 9.6–13.1)

## 2014-06-10 LAB — HEPATIC FUNCTION PANEL
ALT: 25 U/L (ref 0–53)
AST: 17 U/L (ref 0–37)
Albumin: 3.3 g/dL — ABNORMAL LOW (ref 3.5–5.2)
Alkaline Phosphatase: 70 U/L (ref 39–117)
Bilirubin, Direct: 0 mg/dL (ref 0.0–0.3)
Total Bilirubin: 0.4 mg/dL (ref 0.2–1.2)
Total Protein: 6.8 g/dL (ref 6.0–8.3)

## 2014-06-16 ENCOUNTER — Ambulatory Visit: Payer: 59 | Admitting: Cardiology

## 2014-06-16 ENCOUNTER — Ambulatory Visit: Payer: 59 | Admitting: Internal Medicine

## 2014-06-17 ENCOUNTER — Encounter (HOSPITAL_COMMUNITY)
Admission: RE | Disposition: A | Discharge status: Home / Self Care | Payer: Self-pay | Source: Ambulatory Visit | Attending: Internal Medicine

## 2014-06-17 ENCOUNTER — Ambulatory Visit (HOSPITAL_COMMUNITY)
Admission: RE | Admit: 2014-06-17 | Discharge: 2014-06-18 | Disposition: A | Discharge status: Home / Self Care | Payer: 59 | Source: Ambulatory Visit | Attending: Internal Medicine | Admitting: Internal Medicine

## 2014-06-17 ENCOUNTER — Encounter (HOSPITAL_COMMUNITY): Payer: Self-pay | Admitting: Cardiology

## 2014-06-17 DIAGNOSIS — R030 Elevated blood-pressure reading, without diagnosis of hypertension: Secondary | ICD-10-CM

## 2014-06-17 DIAGNOSIS — F17210 Nicotine dependence, cigarettes, uncomplicated: Secondary | ICD-10-CM | POA: Insufficient documentation

## 2014-06-17 DIAGNOSIS — I472 Ventricular tachycardia: Secondary | ICD-10-CM | POA: Insufficient documentation

## 2014-06-17 DIAGNOSIS — I493 Ventricular premature depolarization: Secondary | ICD-10-CM | POA: Diagnosis present

## 2014-06-17 DIAGNOSIS — IMO0001 Reserved for inherently not codable concepts without codable children: Secondary | ICD-10-CM

## 2014-06-17 DIAGNOSIS — Z79899 Other long term (current) drug therapy: Secondary | ICD-10-CM | POA: Diagnosis not present

## 2014-06-17 HISTORY — PX: ABLATION: SHX5711

## 2014-06-17 HISTORY — PX: V-TACH ABLATION: SHX5498

## 2014-06-17 LAB — POCT ACTIVATED CLOTTING TIME
ACTIVATED CLOTTING TIME: 186 s
ACTIVATED CLOTTING TIME: 157 s

## 2014-06-17 SURGERY — V-TACH ABLATION
Anesthesia: LOCAL

## 2014-06-17 MED ORDER — ONDANSETRON HCL 4 MG/2ML IJ SOLN: 4.0000 mg | Freq: Four times a day (QID) | INTRAMUSCULAR | Status: DC | PRN

## 2014-06-17 MED ORDER — HEPARIN SODIUM (PORCINE) 1000 UNIT/ML IJ SOLN
INTRAMUSCULAR | Status: AC
  Filled 2014-06-17: qty 1

## 2014-06-17 MED ORDER — ACETAMINOPHEN 325 MG PO TABS: 650.0000 mg | ORAL_TABLET | ORAL | Status: DC | PRN

## 2014-06-17 MED ORDER — MIDAZOLAM HCL 5 MG/5ML IJ SOLN
INTRAMUSCULAR | Status: AC
  Filled 2014-06-17: qty 5

## 2014-06-17 MED ORDER — SODIUM CHLORIDE 0.9 % IJ SOLN
3.0000 mL | Freq: Two times a day (BID) | INTRAMUSCULAR | Status: DC
  Administered 2014-06-17: 3 mL via INTRAVENOUS

## 2014-06-17 MED ORDER — SODIUM CHLORIDE 0.9 % IV SOLN: 250.0000 mL | INTRAVENOUS | Status: DC | PRN

## 2014-06-17 MED ORDER — HEPARIN (PORCINE) IN NACL 2-0.9 UNIT/ML-% IJ SOLN
INTRAMUSCULAR | Status: AC
  Filled 2014-06-17: qty 500

## 2014-06-17 MED ORDER — SODIUM CHLORIDE 0.9 % IJ SOLN: 3.0000 mL | INTRAMUSCULAR | Status: DC | PRN

## 2014-06-17 MED ORDER — BUPIVACAINE HCL (PF) 0.25 % IJ SOLN
INTRAMUSCULAR | Status: AC
  Filled 2014-06-17: qty 30

## 2014-06-17 MED ORDER — PRAVASTATIN SODIUM 40 MG PO TABS
40.0000 mg | ORAL_TABLET | Freq: Every evening | ORAL | Status: DC
  Administered 2014-06-17: 40 mg via ORAL
  Filled 2014-06-17 (×3): qty 1

## 2014-06-17 MED ORDER — FENTANYL CITRATE 0.05 MG/ML IJ SOLN
INTRAMUSCULAR | Status: AC
  Filled 2014-06-17: qty 2

## 2014-06-17 MED ORDER — ZOLPIDEM TARTRATE 5 MG PO TABS
10.0000 mg | ORAL_TABLET | Freq: Every evening | ORAL | Status: DC | PRN
  Administered 2014-06-17: 10 mg via ORAL
  Filled 2014-06-17: qty 2

## 2014-06-17 NOTE — Interval H&P Note (Signed)
History and Physical Interval Note:  06/17/2014 7:05 AM  Larry Rivera  has presented today for surgery, with the diagnosis of PVC  The various methods of treatment have been discussed with the patient and family. After consideration of risks, benefits and other options for treatment, the patient has consented to  Procedure(s): PVC ABLATION (N/A) as a surgical intervention .  The patient's history has been reviewed, patient examined, no change in status, stable for surgery.  I have reviewed the patient's chart and labs.  Questions were answered to the patient's satisfaction.     [name].D.

## 2014-06-17 NOTE — H&P (View-Only) (Signed)
HPI [name] is referred today by Dr. Delton SeeNelson for evaluation of very frequent and dense ventricular ectopy. He is a very pleasant 48 year old man who developed ventricular tachycardia approximately 6 years ago during a stress test. Subsequent workup was unrevealing as he had preserved left ventricular function and no obstructive coronary artery disease. The patient has minimal palpitations and has not had syncope. He was found to have very dense ventricular ectopy an ECG and subsequently had a 24-hour Holter monitor demonstrating 47,000 PVCs in a 24-hour period. Additional evaluation with echocardiography demonstrates mild left ventricular dysfunction. Cardiac MRI scan is pending. He has had his ectopy despite beta blocker therapy. Allergies  Allergen Reactions  . Penicillins     Not sure, was told to not take .Marland Kitchen.     Current Outpatient Prescriptions  Medication Sig Dispense Refill  . cholecalciferol (VITAMIN D) 1000 UNITS tablet Take 1 tablet (1,000 Units total) by mouth daily.  100 tablet  3  . metoprolol tartrate (LOPRESSOR) 50 MG tablet Take 1 tablet (50 mg total) by mouth 2 (two) times daily.  60 tablet  3  . naproxen (NAPROSYN) 500 MG tablet TAKE 1 TABLET BY MOUTH TWICE A DAY WITH A MEAL  60 tablet  0  . OVER THE COUNTER MEDICATION Take by mouth daily as needed. Equate Vegetable Laxative.      . pravastatin (PRAVACHOL) 40 MG tablet Take 1 tablet (40 mg total) by mouth every evening.  90 tablet  4  . zolpidem (AMBIEN) 10 MG tablet Take 1 tablet (10 mg total) by mouth at bedtime as needed for sleep.  30 tablet  3   No current facility-administered medications for this visit.     Past Medical History  Diagnosis Date  . Psychosexual dysfunction with inhibited sexual excitement   . Other and unspecified hyperlipidemia   . Paroxysmal ventricular tachycardia   . Insomnia, unspecified   . Allergic rhinitis due to pollen   . Unspecified part of closed fracture of clavicle      ROS:   All systems reviewed and negative except as noted in the HPI.   Past Surgical History  Procedure Laterality Date  . Cystectomy      Back of neck  . Rhinoplasty      Deviated septum     Family History  Problem Relation Age of Onset  . Lung disease Maternal Grandmother   . Stroke Maternal Grandmother   . Rheum arthritis Mother   . Diabetes Mother     neuropathy  . Hyperlipidemia Father   . Diabetes Sister   . Cancer Neg Hx   . Heart disease Neg Hx      History   Social History  . Marital Status: Married    Spouse Name: N/A    Number of Children: 1  . Years of Education: 12   Occupational History  . 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 Smoking status: Current Every Day Smoker    Types: Cigars  . Smokeless tobacco: Never Used     Comment: Cigar Only: not every day.  . Alcohol Use: Yes     Comment: rare drink  . Drug Use: No  . Sexual Activity: Yes    Partners: Female   Other Topics Concern  . Not on file   Social History Narrative   HSG. Married '91. 1 dtr '95. 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     BP 112/58  Pulse 87  Ht 6\' 2"  (1.88 m)  Wt 241 lb 3.2 oz (109.408 kg)  BMI 30.96 kg/m2  Physical Exam:  Well appearing NAD HEENT: Unremarkable Neck:  6 cm JVD, no thyromegally Back:  No CVA tenderness Lungs:  Clear with no wheezes, rales, or rhonchi. HEART:  IRegular rate rhythm, no murmurs, no rubs, no clicks Abd:  soft, positive bowel sounds, no organomegally, no rebound, no guarding Ext:  2 plus pulses, no edema, no cyanosis, no clubbing Skin:  No rashes no nodules Neuro:  CN II through XII intact, motor grossly intact  EKG - normal sinus rhythm with PVCs in a bigeminal distribution  Assess/Plan:

## 2014-06-17 NOTE — Progress Notes (Signed)
Site area: RT IJ Site Prior to Removal:  Level 0 Pressure Applied For:83min Manual:  yes  Patient Status During Pull:  stable Post Pull Site:  Level 0 Post Pull Instructions Given: yes  Post Pull Pulses Present:  Dressing Applied:  clear Bedrest begins @ 1110 Comments: by Velva Harman

## 2014-06-17 NOTE — Progress Notes (Signed)
Site area: RFA/RFV x2 Site Prior to Removal:  Level 0 Pressure Applied For:20 Manual:   yes Patient Status During Pull:  stable Post Pull Site:  Level o Post Pull Instructions Given: yes  Post Pull Pulses Present: palpable Dressing Applied:  clear Bedrest begins @ 1115 Comments:by Teacher, English as a foreign language

## 2014-06-17 NOTE — CV Procedure (Signed)
Electrophysiology procedure note  Procedure: Electrophysiologic study and catheter ablation of left ventricular PVCs utilizing three-dimensional electro anatomic mapping  Preprocedure diagnosis: Symptomatic PVCs, refractory to medical therapy  Postprocedure diagnoses: Same as preprocedure diagnosis  Description of the procedure: After informed consent was obtained, the patient was taken to the diagnostic electrophysiology lab in fasting state. After the usual preparation and draping, intravenous Versed and fentanyl were used for sedation. Baseline 12-lead EKG was evaluated. The patient was in sinus rhythm with ventricular trigeminy. Previously, he had had over 50,000 PVCs in a 24-hour period despite medical therapy with beta blockers. He had developed left ventricular dysfunction. A 6 French quadripolar catheter was inserted percutaneously into the right femoral vein and advanced to the right ventricle. A 6 French quadripolar catheter was inserted percutaneously into the right femoral vein and advanced to the His bundle. A 6 French hexapolar catheter was inserted percutaneously into the right jugular vein and advanced to the coronary sinus. The patient had persistent PVCs in a trigeminal fashion. The QRS morphology demonstrated a right bundle branch block PVC with an inferior axis, suggesting that the PVCs were originating high in the left ventricle near the base. The right femoral artery was punctured and a 7 French quadripolar electrical anatomic mapping catheter was inserted percutaneously and advanced under fluoroscopic guidance into the left ventricle. A shell of the left ventricle was created with three-dimensional electro anatomic mapping. PVCs persisted. The ablation catheter was maneuvered into the region of the base of the left ventricle and flexed up onto the mitral annulus, on the left ventricular insertion. After position approximately 1:00 in the LAO projection, pace map mapping was carried out  demonstrating over a 90% match. 5 radiofrequency energy applications were then delivered. During radiofrequency energy application, the PVCs resolved. The patient was observed for 45 minutes and had not a single PVC following the ablation. Rapid ventricular pacing was carried out from the right ventricle and stepwise decrease down to 330 ms VA block was demonstrated. Programmed ventricular stimulation was carried out from the right ventricle and the patient cycle length of 500 ms. The S1-S2 interval was stepwise decrease down to 240 ms were ventricular refractoriness was observed. Next rapid atrial pacing was carried out from the atrium at a pacing cycle length of 500 ms, and stepwise decrease down to 310 ms were A-V block was demonstrated. During rapid atrial pacing, the PR interval was greater than the RR interval but there was no inducible SVT. Finally, programmed atrial stimulation was carried out from the coronary sinus at a pacing cycle length of 500 ms. The S1-S2 interval was stepwise decreased down to 260 ms, where atrial refractoriness was observed. During programmed atrial stimulation, there was in  AH jump, but no echo beats, and no inducible SVT. After 45 minutes, there was not a single PVC, and the catheters were removed, hemostasis assured, and the patient returned to the recovery area in satisfactory condition.   Complications: none immediately.  Results. 1. Baseline ECG. The baseline ECG demonstrated sinus rhythm with PVCs in a trigeminal fashion. 2. Baseline intervals. Sinus node cycle length was 1000 ms. The QRS duration was 70 ms. The AH interval was 78 ms. The HV interval was 53 ms. 3. Rapid ventricular pacing. Rapid ventricular pacing was carried out from the right ventricle following catheter ablation, and demonstrated VA block at 330 ms. During rapid ventricular pacing, the atrial activation sequence was midline and decremental. 4. Programmed ventricular stimulation. Programmed  ventricular stimulation was carried out  from the right ventricle and the patient cycle length of 500 ms. The S1-S2 interval was stepwise decreased from 440 ms down to 240 ms where ventricular refractoriness was observed. During programmed ventricular stimulation, the atrial activation sequence was midline and decremental. 5. Programmed atrial stimulation. Programmed atrial stimulation was carried out from the coronary sinus at a pacing cycle length of 500 ms. The S1-S2 interval was stepwise decreased from 440 ms down to 260 ms were atrial refractoriness was observed.during programmed atrial stimulation, there were multiple AH jumps, but no echo beats, and no inducible SVT. 6. Rapid atrial pacing. Rapid atrial pacing was carried out from the coronary sinus and stepwise decrease down to 310 ms were AV block was observed. During rapid atrial pacing, the PR interval was greater than the RR interval but there was no inducible SVT. 7. Arrhythmias observed. A. Ventricular trigeminy. Initiation, present at the time of the procedure. Duration, sustained. QRS morphology, right bundle, left superior axis. Method of termination, catheter ablation 8. Mapping. Mapping of the patient's PVCs demonstrated in the RAO projection, that the PVCs were originating from the base of the left ventricle at 12:00. 9. Radiofrequency energy application. A total of 5 radiofrequency energy applications were delivered. Following radiofrequency energy application, the patient was observed for 45 minutes and had not a single PVC.  Conclusion: Successful electrical is about a study and catheter ablation utilizing 3-dimensional electro anatomic mapping of symptomatic, medically refractory PVCs, originating from the base of the left ventricle at approximately 12:00 just anterior to the mitral valve annulus. Following ablation, there is no PVCs present.  Lewayne BuntingGregg Nadalee Neiswender, M.D.

## 2014-06-18 ENCOUNTER — Encounter (HOSPITAL_COMMUNITY): Payer: Self-pay | Admitting: Physician Assistant

## 2014-06-18 DIAGNOSIS — I493 Ventricular premature depolarization: Secondary | ICD-10-CM | POA: Diagnosis not present

## 2014-06-18 MED ORDER — METOPROLOL TARTRATE 25 MG PO TABS: 25.0000 mg | ORAL_TABLET | Freq: Two times a day (BID) | ORAL | Status: DC

## 2014-06-18 NOTE — Plan of Care (Signed)
Problem: Phase I Progression Outcomes Goal: Pain controlled with appropriate interventions Outcome: Completed/Met Date Met:  06/18/14 Goal: Initial discharge plan identified Outcome: Completed/Met Date Met:  06/18/14 Goal: Voiding-avoid urinary catheter unless indicated Outcome: Completed/Met Date Met:  06/18/14 Goal: Hemodynamically stable Outcome: Completed/Met Date Met:  06/18/14 Goal: Distal pulses equal to baseline Outcome: Completed/Met Date Met:  06/18/14 Goal: Vascular site scale level 0 - I Vascular Site Scale Level 0: No bruising/bleeding/hematoma Level I (Mild): Bruising/Ecchymosis, minimal bleeding/ooozing, palpable hematoma < 3 cm Level II (Moderate): Bleeding not affecting hemodynamic parameters, pseudoaneurysm, palpable hematoma > 3 cm Level III (Severe) Bleeding which affects hemodynamic parameters or retroperitoneal hemorrhage  Outcome: Completed/Met Date Met:  06/18/14

## 2014-06-18 NOTE — Progress Notes (Signed)
Patient ID: [name], male   DOB: 1965-08-16, 48 y.o.   MRN: [id number]    Patient Name: Larry Rivera Date of Encounter: 06/18/2014     Active Problems:   Symptomatic PVCs   PVC (premature ventricular contraction)    SUBJECTIVE  No chest pain sob or palpitations, s/p ablation of PVC's originating from the superior base of the left ventricle.  CURRENT MEDS . pravastatin  40 mg Oral QPM  . sodium chloride  3 mL Intravenous Q12H    OBJECTIVE  Filed Vitals:   06/17/14 1600 06/17/14 1700 06/17/14 2129 06/18/14 0426  BP: 113/66 107/66 127/71 118/79  Pulse: 69 68 59 69  Temp:   98 F (36.7 C) 98.1 F (36.7 C)  TempSrc:   Oral Oral  Resp:   18 18  Height:      Weight:      SpO2:   99% 99%    Intake/Output Summary (Last 24 hours) at 06/18/14 0752 Last data filed at 06/17/14 2057  Gross per 24 hour  Intake      0 ml  Output    825 ml  Net   -825 ml   Filed Weights   06/17/14 0556  Weight: 240 lb (108.863 kg)    PHYSICAL EXAM  General: Pleasant, NAD. Neuro: Alert and oriented X 3. Moves all extremities spontaneously. Psych: Normal affect. HEENT:  Normal  Neck: Supple without bruits or JVD. Lungs:  Resp regular and unlabored, CTA. Heart: RRR no s3, s4, or murmurs. Abdomen: Soft, non-tender, non-distended, BS + x 4.  Extremities: No clubbing, cyanosis or edema. DP/PT/Radials 2+ and equal bilaterally.  Accessory Clinical Findings  CBC No results for input(s): WBC, NEUTROABS, HGB, HCT, MCV, PLT in the last 72 hours. Basic Metabolic Panel No results for input(s): NA, K, CL, CO2, GLUCOSE, BUN, CREATININE, CALCIUM, MG, PHOS in the last 72 hours. Liver Function Tests No results for input(s): AST, ALT, ALKPHOS, BILITOT, PROT, ALBUMIN in the last 72 hours. No results for input(s): LIPASE, AMYLASE in the last 72 hours. Cardiac Enzymes No results for input(s): CKTOTAL, CKMB, CKMBINDEX, TROPONINI in the last 72 hours. BNP Invalid input(s): POCBNP D-Dimer No  results for input(s): DDIMER in the last 72 hours. Hemoglobin A1C No results for input(s): HGBA1C in the last 72 hours. Fasting Lipid Panel No results for input(s): CHOL, HDL, LDLCALC, TRIG, CHOLHDL, LDLDIRECT in the last 72 hours. Thyroid Function Tests No results for input(s): TSH, T4TOTAL, T3FREE, THYROIDAB in the last 72 hours.  Invalid input(s): FREET3  TELE  Nsr. 2 pvcs seen overnight.  Radiology/Studies  No results found.  ASSESSMENT AND PLAN  1. Symptomatic PVC's refractory to medical therapy 2. S/p catheter ablation Rec: ok for discharge. May return to work in 5 days. Decrease dose of metoprolol to 25 mg twice daily.  Gregg Taylor,M.D.  06/18/2014 7:52 AM

## 2014-06-18 NOTE — Progress Notes (Signed)
DC IV and tele per MD orders and protocol; DC instructions reviewed with patient; no further questions from patient.  Alisea Matte M, RN  

## 2014-06-18 NOTE — Discharge Summary (Signed)
Physician Discharge Summary    Cardiologist:  Nelson/Cybil Senegal  Patient ID: Larry Rivera MRN: [id number] DOB/AGE: 48-15-1967 48 y.o.  Admit date: 06/17/2014 Discharge date: 06/18/2014  Admission Diagnoses:  Symptomatic PVCs  Discharge Diagnoses:  Active Problems:   Symptomatic PVCs   PVC (premature ventricular contraction)   Discharged Condition: stable  Hospital Course:   Larry Rivera was referred by Dr. Delton See for evaluation of very frequent and dense ventricular ectopy. He is a very pleasant 48 year old man who developed ventricular tachycardia approximately 6 years ago during a stress test. Subsequent workup was unrevealing as he had preserved left ventricular function and no obstructive coronary artery disease. The patient has minimal palpitations and has not had syncope. He was found to have very dense ventricular ectopy an ECG and subsequently had a 24-hour Holter monitor demonstrating 47,000 PVCs in a 24-hour period. Additional evaluation with echocardiography demonstrates mild left ventricular dysfunction. Cardiac MRI pending . He has had his ectopy despite beta blocker therapy.    The patient was admitted and underwent successful electrical study and catheter ablation utilizing 3-dimensional electro anatomic mapping of symptomatic, medically refractory PVCs, originating from the base of the left ventricle at approximately 12:00 just anterior to the mitral valve annulus. Following ablation, there is no PVCs present.  He will be continued on 25mg  Toprol BID.   The patient was seen by Dr. Ladona Ridgel who felt he was stable for DC home.   Consults:  None  Significant Diagnostic Studies: Procedure: Electrophysiologic study and catheter ablation of left ventricular PVCs utilizing three-dimensional electro anatomic mapping  Preprocedure diagnosis: Symptomatic PVCs, refractory to medical therapy  Postprocedure diagnoses: Same as preprocedure diagnosis  Description of the procedure: After  informed consent was obtained, the patient was taken to the diagnostic electrophysiology lab in fasting state. After the usual preparation and draping, intravenous Versed and fentanyl were used for sedation. Baseline 12-lead EKG was evaluated. The patient was in sinus rhythm with ventricular trigeminy. Previously, he had had over 50,000 PVCs in a 24-hour period despite medical therapy with beta blockers. He had developed left ventricular dysfunction. A 6 French quadripolar catheter was inserted percutaneously into the right femoral vein and advanced to the right ventricle. A 6 French quadripolar catheter was inserted percutaneously into the right femoral vein and advanced to the His bundle. A 6 French hexapolar catheter was inserted percutaneously into the right jugular vein and advanced to the coronary sinus. The patient had persistent PVCs in a trigeminal fashion. The QRS morphology demonstrated a right bundle branch block PVC with an inferior axis, suggesting that the PVCs were originating high in the left ventricle near the base. The right femoral artery was punctured and a 7 French quadripolar electrical anatomic mapping catheter was inserted percutaneously and advanced under fluoroscopic guidance into the left ventricle. A shell of the left ventricle was created with three-dimensional electro anatomic mapping. PVCs persisted. The ablation catheter was maneuvered into the region of the base of the left ventricle and flexed up onto the mitral annulus, on the left ventricular insertion. After position approximately 1:00 in the LAO projection, pace map mapping was carried out demonstrating over a 90% match. 5 radiofrequency energy applications were then delivered. During radiofrequency energy application, the PVCs resolved. The patient was observed for 45 minutes and had not a single PVC following the ablation. Rapid ventricular pacing was carried out from the right ventricle and stepwise decrease down to 330 ms  VA block was demonstrated. Programmed ventricular stimulation was carried out from  the right ventricle and the patient cycle length of 500 ms. The S1-S2 interval was stepwise decrease down to 240 ms were ventricular refractoriness was observed. Next rapid atrial pacing was carried out from the atrium at a pacing cycle length of 500 ms, and stepwise decrease down to 310 ms were A-V block was demonstrated. During rapid atrial pacing, the PR interval was greater than the RR interval but there was no inducible SVT. Finally, programmed atrial stimulation was carried out from the coronary sinus at a pacing cycle length of 500 ms. The S1-S2 interval was stepwise decreased down to 260 ms, where atrial refractoriness was observed. During programmed atrial stimulation, there was in AH jump, but no echo beats, and no inducible SVT. After 45 minutes, there was not a single PVC, and the catheters were removed, hemostasis assured, and the patient returned to the recovery area in satisfactory condition.   Complications: none immediately.  Results. 1. Baseline ECG. The baseline ECG demonstrated sinus rhythm with PVCs in a trigeminal fashion. 2. Baseline intervals. Sinus node cycle length was 1000 ms. The QRS duration was 70 ms. The AH interval was 78 ms. The HV interval was 53 ms. 3. Rapid ventricular pacing. Rapid ventricular pacing was carried out from the right ventricle following catheter ablation, and demonstrated VA block at 330 ms. During rapid ventricular pacing, the atrial activation sequence was midline and decremental. 4. Programmed ventricular stimulation. Programmed ventricular stimulation was carried out from the right ventricle and the patient cycle length of 500 ms. The S1-S2 interval was stepwise decreased from 440 ms down to 240 ms where ventricular refractoriness was observed. During programmed ventricular stimulation, the atrial activation sequence was midline and decremental. 5. Programmed atrial  stimulation. Programmed atrial stimulation was carried out from the coronary sinus at a pacing cycle length of 500 ms. The S1-S2 interval was stepwise decreased from 440 ms down to 260 ms were atrial refractoriness was observed.during programmed atrial stimulation, there were multiple AH jumps, but no echo beats, and no inducible SVT. 6. Rapid atrial pacing. Rapid atrial pacing was carried out from the coronary sinus and stepwise decrease down to 310 ms were AV block was observed. During rapid atrial pacing, the PR interval was greater than the RR interval but there was no inducible SVT. 7. Arrhythmias observed. A. Ventricular trigeminy. Initiation, present at the time of the procedure. Duration, sustained. QRS morphology, right bundle, left superior axis. Method of termination, catheter ablation 8. Mapping. Mapping of the patient's PVCs demonstrated in the RAO projection, that the PVCs were originating from the base of the left ventricle at 12:00. 9. Radiofrequency energy application. A total of 5 radiofrequency energy applications were delivered. Following radiofrequency energy application, the patient was observed for 45 minutes and had not a single PVC.  Conclusion: Successful electrical is about a study and catheter ablation utilizing 3-dimensional electro anatomic mapping of symptomatic, medically refractory PVCs, originating from the base of the left ventricle at approximately 12:00 just anterior to the mitral valve annulus. Following ablation, there is no PVCs present.  Larry Rivera, M.D.      Treatments: See above  Discharge Exam: Blood pressure 118/79, pulse 69, temperature 98.1 F (36.7 C), temperature source Oral, resp. rate 18, height 6\' 1"  (1.854 m), weight 240 lb (108.863 kg), SpO2 99 %.   Disposition:       Discharge Instructions    Diet - low sodium heart healthy    Complete by:  As directed  Discharge instructions    Complete by:  As directed   No heavy lifting for  five days, less then 10#     Increase activity slowly    Complete by:  As directed             Medication List    TAKE these medications        Melatonin 10 MG Caps  Take 10 mg by mouth at bedtime as needed (for sleep).     metoprolol tartrate 25 MG tablet  Commonly known as:  LOPRESSOR  Take 1 tablet (25 mg total) by mouth 2 (two) times daily.     pravastatin 40 MG tablet  Commonly known as:  PRAVACHOL  Take 1 tablet (40 mg total) by mouth every evening.     zolpidem 10 MG tablet  Commonly known as:  AMBIEN  Take 1 tablet (10 mg total) by mouth at bedtime as needed for sleep.       Follow-up Information    Follow up with Larry BuntingGregg Melvinia Ashby, MD On 07/15/2014.   Specialty:  Cardiology   Why:  3:00 PM   Contact information:   [address] [place] [address] [phone]       Signed: Wilburt FinlayHAGER, Larry, [facility] 06/18/2014, 8:11 AM  EP Attending  Patient seen and examined. Agree with above.   Larry Rivera,M.D.

## 2014-06-18 NOTE — Discharge Instructions (Signed)
May return to work on Monday, June 23, 2014

## 2014-07-06 ENCOUNTER — Encounter (HOSPITAL_COMMUNITY): Payer: Self-pay | Admitting: *Deleted

## 2014-07-15 ENCOUNTER — Encounter: Payer: Self-pay | Admitting: Internal Medicine

## 2014-07-15 ENCOUNTER — Ambulatory Visit (INDEPENDENT_AMBULATORY_CARE_PROVIDER_SITE_OTHER): Payer: 59 | Admitting: Internal Medicine

## 2014-07-15 VITALS — BP 110/90 | HR 89 | Ht 73.0 in | Wt 247.0 lb

## 2014-07-15 DIAGNOSIS — R030 Elevated blood-pressure reading, without diagnosis of hypertension: Secondary | ICD-10-CM

## 2014-07-15 DIAGNOSIS — I10 Essential (primary) hypertension: Secondary | ICD-10-CM | POA: Insufficient documentation

## 2014-07-15 DIAGNOSIS — IMO0001 Reserved for inherently not codable concepts without codable children: Secondary | ICD-10-CM

## 2014-07-15 DIAGNOSIS — I493 Ventricular premature depolarization: Secondary | ICD-10-CM

## 2014-07-15 MED ORDER — METOPROLOL TARTRATE 25 MG PO TABS: 25.0000 mg | ORAL_TABLET | Freq: Two times a day (BID) | ORAL | Status: DC

## 2014-07-15 NOTE — Patient Instructions (Signed)
Your physician recommends that you schedule a follow-up appointment as needed  

## 2014-07-15 NOTE — Assessment & Plan Note (Signed)
His blood pressure is good today on metoprolol. I have recommended her continue metoprolol for now and followup with Dr. Delton See in a couple of months and his metoprolol can be stopped at that time if he is doing well.

## 2014-07-15 NOTE — Progress Notes (Signed)
HPI Mr. Muhle returns today for followup. He is a pleasant 48 yo man with a h/o PVC's originating from the LV who underwent catheter ablation several  weeks ago.  In the interim he has done well. He had over 20,000 PVC's a day prior to ablation. He has not felt any PVC's since. On my exam today he did not have a PVC in over a hundred heart beats that I listened to. He denies chest pain or sob. He remains on his beta blocker for control of his blood pressure.   Allergies  Allergen Reactions  . Penicillins Other (See Comments)    Not sure, was told to not take .Marland Kitchen     Current Outpatient Prescriptions  Medication Sig Dispense Refill  . Melatonin 10 MG CAPS Take 10 mg by mouth at bedtime as needed (for sleep).    . metoprolol tartrate (LOPRESSOR) 25 MG tablet Take 1 tablet (25 mg total) by mouth 2 (two) times daily. 180 tablet 3  . pravastatin (PRAVACHOL) 40 MG tablet Take 1 tablet (40 mg total) by mouth every evening. 90 tablet 4  . Psyllium (VEGETABLE LAXATIVE PO) Take 1 capsule by mouth daily as needed (CONSTIPATION).    Marland Kitchen zolpidem (AMBIEN) 10 MG tablet Take 1 tablet (10 mg total) by mouth at bedtime as needed for sleep. 30 tablet 3   No current facility-administered medications for this visit.     Past Medical History  Diagnosis Date  . Psychosexual dysfunction with inhibited sexual excitement   . Other and unspecified hyperlipidemia   . Paroxysmal ventricular tachycardia   . Insomnia, unspecified   . Allergic rhinitis due to pollen   . Unspecified part of closed fracture of clavicle     ROS:   All systems reviewed and negative except as noted in the HPI.   Past Surgical History  Procedure Laterality Date  . Cystectomy      Back of neck  . Rhinoplasty      Deviated septum  . Ablation  06/17/14    PVC ablation by Dr Ladona Ridgel - originating from the base of the left ventricle at approximately 12:00 just anterior to the mitral valve annulus     Family History    Problem Relation Age of Onset  . Lung disease Maternal Grandmother   . Stroke Maternal Grandmother   . Rheum arthritis Mother   . Diabetes Mother     neuropathy  . Hyperlipidemia Father   . Diabetes Sister   . Cancer Neg Hx   . Heart disease Neg Hx      History   Social History  . Marital Status: Married    Spouse Name: N/A    Number of Children: 1  . Years of Education: 12   Occupational History  . 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 Smoking status: Current Every Day Smoker -- 0.50 packs/day for 30 years    Types: Cigars  . Smokeless tobacco: Never Used     Comment: Cigar Only: not every day.  . Alcohol Use: Yes     Comment: rare drink  . Drug Use: No  . Sexual Activity:    Partners: Female   Other Topics Concern  . Not on file   Social History Narrative   HS[employer]. Married '91. 1 dtr '95. Work - Naval architect - long haul.     BP 110/90 mmHg  Pulse 89  Ht 6\' 1"  (1.854 m)  Wt 247 lb (112.038 kg)  BMI 32.59 kg/m2  Physical Exam:  Well appearing 48 yo man, NAD HEENT: Unremarkable Neck:  No JVD, no thyromegally Lymphatics:  No adenopathy Back:  No CVA tenderness Lungs:  Clear with no wheezes HEART:  Regular rate rhythm, no murmurs, no rubs, no clicks Abd:  soft, positive bowel sounds, no organomegally, no rebound, no guarding Ext:  2 plus pulses, no edema, no cyanosis, no clubbing Skin:  No rashes no nodules Neuro:  CN II through XII intact, motor grossly intact  ECG - NSR  Assess/Plan:

## 2014-07-15 NOTE — Assessment & Plan Note (Signed)
He is doing well s/p catheter ablation. No change in medical therapy.

## 2014-07-21 ENCOUNTER — Encounter: Payer: Self-pay | Admitting: Internal Medicine

## 2014-07-21 ENCOUNTER — Ambulatory Visit (INDEPENDENT_AMBULATORY_CARE_PROVIDER_SITE_OTHER): Payer: 59 | Admitting: Internal Medicine

## 2014-07-21 ENCOUNTER — Other Ambulatory Visit (INDEPENDENT_AMBULATORY_CARE_PROVIDER_SITE_OTHER): Payer: 59

## 2014-07-21 VITALS — BP 132/80 | HR 63 | Temp 98.1°F | Resp 16 | Ht 73.0 in | Wt 242.0 lb

## 2014-07-21 DIAGNOSIS — R030 Elevated blood-pressure reading, without diagnosis of hypertension: Secondary | ICD-10-CM

## 2014-07-21 DIAGNOSIS — IMO0001 Reserved for inherently not codable concepts without codable children: Secondary | ICD-10-CM

## 2014-07-21 DIAGNOSIS — G4700 Insomnia, unspecified: Secondary | ICD-10-CM

## 2014-07-21 DIAGNOSIS — R739 Hyperglycemia, unspecified: Secondary | ICD-10-CM

## 2014-07-21 DIAGNOSIS — I493 Ventricular premature depolarization: Secondary | ICD-10-CM

## 2014-07-21 DIAGNOSIS — E785 Hyperlipidemia, unspecified: Secondary | ICD-10-CM

## 2014-07-21 LAB — COMPREHENSIVE METABOLIC PANEL
ALT: 43 U/L (ref 0–53)
AST: 24 U/L (ref 0–37)
Albumin: 4.2 g/dL (ref 3.5–5.2)
Alkaline Phosphatase: 85 U/L (ref 39–117)
BUN: 10 mg/dL (ref 6–23)
CALCIUM: 9.1 mg/dL (ref 8.4–10.5)
CHLORIDE: 103 meq/L (ref 96–112)
CO2: 27 meq/L (ref 19–32)
CREATININE: 1.1 mg/dL (ref 0.4–1.5)
GFR: 78.12 mL/min (ref >60.00)
Glucose, Bld: 98 mg/dL (ref 70–99)
Potassium: 4.2 mEq/L (ref 3.5–5.1)
SODIUM: 138 meq/L (ref 135–145)
TOTAL PROTEIN: 7.3 g/dL (ref 6.0–8.3)
Total Bilirubin: 0.7 mg/dL (ref 0.2–1.2)

## 2014-07-21 LAB — LIPID PANEL
Cholesterol: 220 mg/dL — ABNORMAL HIGH (ref 0–200)
HDL: 35.7 mg/dL — AB (ref >39.00)
LDL Cholesterol: 149 mg/dL — ABNORMAL HIGH (ref 0–99)
NonHDL: 184.3
Total CHOL/HDL Ratio: 6
Triglycerides: 179 mg/dL — ABNORMAL HIGH (ref 0.0–149.0)
VLDL: 35.8 mg/dL (ref 0.0–40.0)

## 2014-07-21 LAB — HEMOGLOBIN A1C: Hgb A1c MFr Bld: 6.5 % (ref 4.6–6.5)

## 2014-07-21 MED ORDER — ZOLPIDEM TARTRATE 10 MG PO TABS: 10.0000 mg | ORAL_TABLET | Freq: Every evening | ORAL | Status: DC | PRN

## 2014-07-21 MED ORDER — VARDENAFIL HCL 10 MG PO TABS: 5.0000 mg | ORAL_TABLET | Freq: Every day | ORAL | Status: DC | PRN

## 2014-07-21 MED ORDER — SUVOREXANT 5 MG PO TABS: 5.0000 mg | ORAL_TABLET | Freq: Every evening | ORAL | Status: DC | PRN

## 2014-07-21 MED ORDER — METOPROLOL TARTRATE 25 MG PO TABS: 25.0000 mg | ORAL_TABLET | Freq: Two times a day (BID) | ORAL | Status: DC

## 2014-07-21 MED ORDER — PRAVASTATIN SODIUM 40 MG PO TABS: 40.0000 mg | ORAL_TABLET | Freq: Every evening | ORAL | Status: DC

## 2014-07-21 NOTE — Patient Instructions (Signed)
We have sent in refills on all your medications. We will give you the refill for the Ambien as well as your new sleeping medicine called Belsomra.  We have also sent in the Levitra and will give you the savings card.  We will see back in about 6-12 months. We will check your blood work today to make sure there are no problems before your physical.  Please feel free to call us with any problems or questions before your next visit.

## 2014-07-21 NOTE — Progress Notes (Signed)
Pre visit review using our clinic review tool, if applicable. No additional management support is needed unless otherwise documented below in the visit note. 

## 2014-07-22 ENCOUNTER — Telehealth: Payer: Self-pay | Admitting: Internal Medicine

## 2014-07-22 NOTE — Telephone Encounter (Signed)
Pt seen 12/7. Pt is concerned about diagnosis on his sheet from visit (PVC, elev BP, freq PVC's). Pt states all this has been resolved/treated (ablation and medication)and no longer applies. 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 Pt states he left message yesterday but this staff member is not finding a phone note. Pt wanted this message taken and relayed to provider, he is also requesting a call if possible.

## 2014-07-23 DIAGNOSIS — R739 Hyperglycemia, unspecified: Secondary | ICD-10-CM | POA: Insufficient documentation

## 2014-07-23 NOTE — Assessment & Plan Note (Signed)
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 When he is at home he does not have difficulty sleeping. We will try switching to belsomra. Unclear if insurance will cover so also given prescription for Ambien that way he will have access to sleeping medication if he needs it. Reminded him he should only take sleeping medication when he is able to get 8 hours of sleep.

## 2014-07-23 NOTE — Assessment & Plan Note (Signed)
On pravastatin, check lipid panel.

## 2014-07-23 NOTE — Telephone Encounter (Signed)
Pt return your call, please call him back tomorrow.

## 2014-07-23 NOTE — Progress Notes (Signed)
   Subjective:    Patient ID: Larry Rivera, male    DOB: 1966-04-19, 48 y.o.   MRN: [id number]  HPI The patient is a 48 year old male who comes in today to establish care. He is a long distance Naval architect. He does fairly well overall. He recently had ablation for frequent PVCs. He is not noticing any PVCs at this time. He is not any have any chest pains, shortness of breath. He does not notice any edema in his legs. He doesn't get much exercise and this is related to his job. He occasionally gets swelling in his right elbow which rests on the gearshift.  Review of Systems  Constitutional: Negative for fever, activity change, appetite change, fatigue and unexpected weight change.  HENT: Negative.   Respiratory: Negative for cough, chest tightness, shortness of breath and wheezing.   Cardiovascular: Negative for chest pain, palpitations and leg swelling.  Gastrointestinal: Negative for abdominal pain, diarrhea, constipation and abdominal distention.  Musculoskeletal: Negative.   Skin: Negative.   Neurological: Negative.       Objective:   Physical Exam  Constitutional: He is oriented to person, place, and time. He appears well-developed and well-nourished.  Overweight  HENT:  Head: Normocephalic and atraumatic.  Eyes: EOM are normal.  Neck: Normal range of motion.  Cardiovascular: Normal rate and regular rhythm.   Pulmonary/Chest: Effort normal and breath sounds normal. No respiratory distress. He has no wheezes. He has no rales.  Abdominal: Soft. Bowel sounds are normal. He exhibits no distension. There is no tenderness. There is no rebound.  Musculoskeletal: He exhibits no edema.  Neurological: He is alert and oriented to person, place, and time. Coordination normal.  Skin: Skin is warm and dry.   Filed Vitals:   07/21/14 1302  BP: 132/80  Pulse: 63  Temp: 98.1 F (36.7 C)  TempSrc: Oral  Resp: 16  Height: 6\' 1"  (1.854 m)  Weight: 242 lb (109.77 kg)  SpO2: 97%        Assessment & Plan:

## 2014-07-23 NOTE — Assessment & Plan Note (Signed)
Check hemoglobin A1c given history of borderline sugars as well as obesity.

## 2014-07-23 NOTE — Assessment & Plan Note (Signed)
No PVCs on exam today, status post ablation. Currently still on metoprolol. Plans from cardiology to stop this in 6 months if he still PVC free.

## 2014-07-23 NOTE — Telephone Encounter (Signed)
Left message for patient to call me back. 

## 2014-07-24 ENCOUNTER — Ambulatory Visit: Payer: 59 | Admitting: Cardiology

## 2014-07-24 ENCOUNTER — Encounter (HOSPITAL_COMMUNITY): Payer: Self-pay | Admitting: Internal Medicine

## 2014-07-24 NOTE — Telephone Encounter (Signed)
Spoke to patient

## 2014-08-18 ENCOUNTER — Ambulatory Visit: Payer: 59 | Admitting: Cardiology

## 2014-09-10 ENCOUNTER — Telehealth: Payer: Self-pay | Admitting: *Deleted

## 2014-09-10 DIAGNOSIS — I493 Ventricular premature depolarization: Secondary | ICD-10-CM

## 2014-09-10 NOTE — Telephone Encounter (Signed)
Order changed per Dr Delton See to regular cardiac stress MRI for symptomatic PVCs, post PVC Ablation.  Will inform scheduling [place] of new change in order.

## 2014-09-10 NOTE — Telephone Encounter (Signed)
-----   Message from Sampson Goon sent at 09/10/2014  2:13 PM EST ----- Regarding: SEE BELOW   ----- Message -----    From: Terri Piedra    Sent: 09/10/2014   1:59 PM      To: Sampson Goon Subject: RE: Cardiac stress MRI                         I have had no luck getting in touch with the office to figure out what they want--the order in the system is for a Nuc med stress cardiac.  If they put in a order for a MRI stress cardiac , I will be happy to schedule.  Lynnette ----- Message -----    From: Sampson Goon    Sent: 09/10/2014   9:25 AM      To: Terri Piedra Subject: Cardiac stress MRI                               Has this been scheduled yet?  Thanks [employer]

## 2014-09-16 ENCOUNTER — Other Ambulatory Visit: Payer: Self-pay | Admitting: *Deleted

## 2014-09-16 ENCOUNTER — Encounter: Payer: Self-pay | Admitting: Cardiology

## 2014-09-16 DIAGNOSIS — Z01812 Encounter for preprocedural laboratory examination: Secondary | ICD-10-CM

## 2014-09-16 NOTE — Progress Notes (Signed)
Pt scheduled for stress cardiac MRI for 10/08/14 and needs pre labs to check a bmet prior to this test.  Scheduled pt lab appt for 10/06/14 added on to OV with Dr Delton See that day.  This lab is per Dr Delton See.

## 2014-10-06 ENCOUNTER — Ambulatory Visit (INDEPENDENT_AMBULATORY_CARE_PROVIDER_SITE_OTHER): Payer: 59 | Admitting: Cardiology

## 2014-10-06 ENCOUNTER — Other Ambulatory Visit (INDEPENDENT_AMBULATORY_CARE_PROVIDER_SITE_OTHER): Payer: 59 | Admitting: *Deleted

## 2014-10-06 ENCOUNTER — Encounter: Payer: Self-pay | Admitting: Cardiology

## 2014-10-06 VITALS — BP 118/74 | HR 68 | Ht 73.0 in | Wt 233.8 lb

## 2014-10-06 DIAGNOSIS — I429 Cardiomyopathy, unspecified: Secondary | ICD-10-CM

## 2014-10-06 DIAGNOSIS — R030 Elevated blood-pressure reading, without diagnosis of hypertension: Secondary | ICD-10-CM

## 2014-10-06 DIAGNOSIS — Z01812 Encounter for preprocedural laboratory examination: Secondary | ICD-10-CM

## 2014-10-06 DIAGNOSIS — E785 Hyperlipidemia, unspecified: Secondary | ICD-10-CM

## 2014-10-06 DIAGNOSIS — I428 Other cardiomyopathies: Secondary | ICD-10-CM

## 2014-10-06 DIAGNOSIS — I493 Ventricular premature depolarization: Secondary | ICD-10-CM

## 2014-10-06 DIAGNOSIS — IMO0001 Reserved for inherently not codable concepts without codable children: Secondary | ICD-10-CM

## 2014-10-06 LAB — COMPREHENSIVE METABOLIC PANEL
ALT: 19 U/L (ref 0–53)
AST: 15 U/L (ref 0–37)
Albumin: 4.2 g/dL (ref 3.5–5.2)
Alkaline Phosphatase: 73 U/L (ref 39–117)
BUN: 16 mg/dL (ref 6–23)
CO2: 27 mEq/L (ref 19–32)
Calcium: 9.3 mg/dL (ref 8.4–10.5)
Chloride: 105 mEq/L (ref 96–112)
Creatinine, Ser: 1.06 mg/dL (ref 0.40–1.50)
GFR: 78.9 mL/min (ref >60.00)
Glucose, Bld: 90 mg/dL (ref 70–99)
Potassium: 4.3 mEq/L (ref 3.5–5.1)
Sodium: 137 mEq/L (ref 135–145)
Total Bilirubin: 0.5 mg/dL (ref 0.2–1.2)
Total Protein: 7.1 g/dL (ref 6.0–8.3)

## 2014-10-06 NOTE — Progress Notes (Signed)
Patient ID: Larry Rivera, male   DOB: 08/25/1965, 49 y.o.   MRN: [id number]     HPI Larry Rivera returns today for followup. He is a pleasant 49 yo man with a h/o PVC's originating from the LV who underwent catheter ablation several  weeks ago.  In the interim he has done well. He had over 20,000 PVC's a day prior to ablation. He has not felt any PVC's since. On my exam today he did not have a PVC in over a hundred heart beats that I listened to. He denies chest pain or sob. He remains on his beta blocker for control of his blood pressure.    The patient underwent radiofrequency ablation of PVCs performed by Dr. Ladona Ridgel was successful and currently he has no palpitations no evidence of PVCs on his EKG. He is trying to lose weight in order to control his blood pressure medication until the ulcer started Atkins diet limitation of right foot and redness. He denies any presyncope or syncope, no chest pain or SOB.   Allergies  Allergen Reactions  . Penicillins Other (See Comments)    Not sure, was told to not take .Marland Kitchen     Current Outpatient Prescriptions  Medication Sig Dispense Refill  . metoprolol tartrate (LOPRESSOR) 25 MG tablet Take 1 tablet (25 mg total) by mouth 2 (two) times daily. 180 tablet 3  . pravastatin (PRAVACHOL) 40 MG tablet Take 1 tablet (40 mg total) by mouth every evening. 90 tablet 4  . Psyllium (VEGETABLE LAXATIVE PO) Take 1 capsule by mouth daily as needed (CONSTIPATION).    Marland Kitchen vardenafil (LEVITRA) 10 MG tablet Take 0.5-1 tablets (5-10 mg total) by mouth daily as needed for erectile dysfunction. 15 tablet 3  . zolpidem (AMBIEN) 10 MG tablet Take 1 tablet (10 mg total) by mouth at bedtime as needed for sleep. 30 tablet 3   No current facility-administered medications for this visit.     Past Medical History  Diagnosis Date  . Psychosexual dysfunction with inhibited sexual excitement   . Other and unspecified hyperlipidemia   . Paroxysmal ventricular tachycardia   . Insomnia,  unspecified   . Allergic rhinitis due to pollen   . Unspecified part of closed fracture of clavicle     ROS:   All systems reviewed and negative except as noted in the HPI.   Past Surgical History  Procedure Laterality Date  . Cystectomy      Back of neck  . Rhinoplasty      Deviated septum  . Ablation  06/17/14    PVC ablation by Dr Ladona Ridgel - originating from the base of the left ventricle at approximately 12:00 just anterior to the mitral valve annulus  . V-tach ablation N/A 06/17/2014    Procedure: PVC ABLATION;  Surgeon: Marinus Maw, MD;  Location: [facility] CATH LAB;  Service: Cardiovascular;  Laterality: N/A;     Family History  Problem Relation Age of Onset  . Lung disease Maternal Grandmother   . Stroke Maternal Grandmother   . Rheum arthritis Mother   . Diabetes Mother     neuropathy  . Hyperlipidemia Father   . Diabetes Sister   . Cancer Neg Hx   . Heart disease Neg Hx      History   Social History  . Marital Status: Married    Spouse Name: N/A  . Number of Children: 1  . Years of Education: 12   Occupational History  . truck driver [employer]  Social History Main Topics  . Smoking status: Current Every Day Smoker -- 0.50 packs/day for 30 years    Types: Cigars  . Smokeless tobacco: Never Used     Comment: Cigar Only: not every day.  . Alcohol Use: Yes     Comment: rare drink  . Drug Use: No  . Sexual Activity:    Partners: Female   Other Topics Concern  . Not on file   Social History Narrative   HSG. Married '91. 1 dtr '95. Work - Naval architect - long haul.     BP 118/74 mmHg  Pulse 68  Ht  (1.854 m)  Wt 233 lb 12.8 oz (106.051 kg)  BMI 30.85 kg/m2  Physical Exam:  Well appearing 49 yo man, NAD HEENT: Unremarkable Neck:  No JVD, no thyromegally Lymphatics:  No adenopathy Back:  No CVA tenderness Lungs:  Clear with no wheezes HEART:  Regular rate rhythm, no murmurs, no rubs, no clicks Abd:  soft, positive bowel sounds, no  organomegally, no rebound, no guarding Ext:  2 plus pulses, no edema, no cyanosis, no clubbing Skin:  No rashes no nodules Neuro:  CN II through XII intact, motor grossly intact  ECG - NSR  Echo:  Left ventricle: Ventricular bigeminy makes evaluation of LVEF difficult. Overall, LVEF appears normal to mildly depressed No definite wall motion abnormalities. The cavity size was moderately dilated. Wall thickness was normal. Doppler parameters are consistent with abnormal left ventricular relaxation (grade 1 diastolic dysfunction). Aortic valve:  Structurally normal valve.  Cusp separation was normal. Doppler: Transvalvular velocity was within the normal range. There was no stenosis. There was no regurgitation. ------------------------------------------------------------------- Mitral valve:  Structurally normal valve.  Leaflet separation was normal. Doppler: Transvalvular velocity was within the normal range. There was no evidence for stenosis. There was no significant regurgitation.  Peak gradient (D): 2 mm Hg. ------------------------------------------------------------------- Left atrium: The atrium was normal in size. ------------------------------------------------------------------- Right ventricle: The cavity size was mildly dilated. Systolic function was normal.    Assess/Plan:    49 year old male  1. Frequent symptomatic monomorphic PVCs - 48 Hour Holter showed > 20.000 PVCs in 24 hours and he underwent RF ablation by Dr Ladona Ridgel with resolution of symptoms and PVCs, to evaluate for PVC burden and evaluate for possible other arrhythmias. Order echo tu rule out structural heart disease. Check electrolytes including Mg and calcium.  He is doing well s/p catheter ablation. No change in medical therapy.  His last echo showed mildly dilated LV with mildly depressed EF, however might have been underestimated as every other beat was bigeminy. We will repeat  in 3 months.  2. Hypertension - controlled, he is on a diet, we will cut metoprolol in half to 12.5 mg PO BID and recheck BP in 3 months, if ok, we will consider to discontinue.  3. Hyperlipidemia - elevated LDL and TAG, on pravastatin, we will repeat in 3 months as he is on a diet.  Larry Rivera 10/06/2014

## 2014-10-06 NOTE — Patient Instructions (Addendum)
Your physician recommends that you continue on your current medications as directed. Please refer to the Current Medication list given to you today.    Your physician has requested that you have an echocardiogram. Echocardiography is a painless test that uses sound waves to create images of your heart. It provides your doctor with information about the size and shape of your heart and how well your heart's chambers and valves are working. This procedure takes approximately one hour. There are no restrictions for this procedure.  PLEASE HAVE THIS SCHEDULED SAME DAY AS YOU LAB APPOINTMENT IN 3 MONTHS, BUT DO THIS PRIOR TO YOUR 3 MONTH FOLLOW-UP APPOINTMENT WITH DR Delton See   Your physician recommends that you return for lab work in: HAVE THIS SCHEDULED PRIOR TO YOUR 3 MONTH FOLLOW-UP APPOINTMENT WITH DR NELSON--CHECK A CMET AND LIPIDS---PLEASE COME FASTING TO THIS APPOINTMENT    Your physician recommends that you schedule a follow-up appointment in: 3 MONTHS WITH DR Delton See

## 2014-10-08 ENCOUNTER — Ambulatory Visit (HOSPITAL_COMMUNITY)
Admission: RE | Admit: 2014-10-08 | Discharge: 2014-10-08 | Disposition: A | Discharge status: Home / Self Care | Payer: 59 | Source: Ambulatory Visit | Attending: Cardiology | Admitting: Cardiology

## 2014-10-08 ENCOUNTER — Other Ambulatory Visit: Payer: Self-pay | Admitting: Cardiology

## 2014-10-08 DIAGNOSIS — I493 Ventricular premature depolarization: Secondary | ICD-10-CM | POA: Diagnosis not present

## 2014-10-08 MED ORDER — GADOBENATE DIMEGLUMINE 529 MG/ML IV SOLN
40.0000 mL | Freq: Once | INTRAVENOUS | Status: AC | PRN
  Administered 2014-10-08: 40 mL via INTRAVENOUS

## 2014-10-13 ENCOUNTER — Other Ambulatory Visit: Payer: Self-pay | Admitting: Internal Medicine

## 2014-11-06 ENCOUNTER — Other Ambulatory Visit: Payer: Self-pay | Admitting: Internal Medicine

## 2014-11-06 ENCOUNTER — Other Ambulatory Visit: Payer: Self-pay | Admitting: Geriatric Medicine

## 2014-11-06 MED ORDER — ZOLPIDEM TARTRATE 10 MG PO TABS: 10.0000 mg | ORAL_TABLET | Freq: Every evening | ORAL | Status: DC | PRN

## 2014-11-28 ENCOUNTER — Other Ambulatory Visit: Payer: Self-pay | Admitting: Cardiology

## 2014-12-15 ENCOUNTER — Other Ambulatory Visit (HOSPITAL_COMMUNITY): Payer: 59

## 2014-12-15 ENCOUNTER — Other Ambulatory Visit: Payer: 59

## 2014-12-22 ENCOUNTER — Ambulatory Visit: Payer: 59 | Admitting: Cardiology

## 2015-01-02 ENCOUNTER — Other Ambulatory Visit: Payer: Self-pay | Admitting: Cardiology

## 2015-01-23 ENCOUNTER — Ambulatory Visit (INDEPENDENT_AMBULATORY_CARE_PROVIDER_SITE_OTHER): Payer: 59 | Admitting: Cardiology

## 2015-01-23 ENCOUNTER — Other Ambulatory Visit (INDEPENDENT_AMBULATORY_CARE_PROVIDER_SITE_OTHER): Payer: 59 | Admitting: *Deleted

## 2015-01-23 ENCOUNTER — Ambulatory Visit (HOSPITAL_COMMUNITY): Payer: 59 | Attending: Internal Medicine

## 2015-01-23 ENCOUNTER — Other Ambulatory Visit: Payer: Self-pay

## 2015-01-23 ENCOUNTER — Encounter: Payer: Self-pay | Admitting: Cardiology

## 2015-01-23 VITALS — BP 118/64 | HR 88 | Ht 73.0 in | Wt 242.0 lb

## 2015-01-23 DIAGNOSIS — I428 Other cardiomyopathies: Secondary | ICD-10-CM

## 2015-01-23 DIAGNOSIS — R030 Elevated blood-pressure reading, without diagnosis of hypertension: Secondary | ICD-10-CM

## 2015-01-23 DIAGNOSIS — R000 Tachycardia, unspecified: Secondary | ICD-10-CM

## 2015-01-23 DIAGNOSIS — I493 Ventricular premature depolarization: Secondary | ICD-10-CM

## 2015-01-23 DIAGNOSIS — E785 Hyperlipidemia, unspecified: Secondary | ICD-10-CM

## 2015-01-23 DIAGNOSIS — I43 Cardiomyopathy in diseases classified elsewhere: Secondary | ICD-10-CM

## 2015-01-23 DIAGNOSIS — I10 Essential (primary) hypertension: Secondary | ICD-10-CM | POA: Insufficient documentation

## 2015-01-23 DIAGNOSIS — I429 Cardiomyopathy, unspecified: Secondary | ICD-10-CM

## 2015-01-23 DIAGNOSIS — IMO0001 Reserved for inherently not codable concepts without codable children: Secondary | ICD-10-CM

## 2015-01-23 DIAGNOSIS — Z720 Tobacco use: Secondary | ICD-10-CM | POA: Insufficient documentation

## 2015-01-23 LAB — COMPREHENSIVE METABOLIC PANEL
ALT: 49 U/L (ref 0–53)
AST: 22 U/L (ref 0–37)
Albumin: 4 g/dL (ref 3.5–5.2)
Alkaline Phosphatase: 77 U/L (ref 39–117)
BUN: 10 mg/dL (ref 6–23)
CO2: 27 mEq/L (ref 19–32)
Calcium: 9 mg/dL (ref 8.4–10.5)
Chloride: 105 mEq/L (ref 96–112)
Creatinine, Ser: 1.08 mg/dL (ref 0.40–1.50)
GFR: 77.13 mL/min (ref >60.00)
Glucose, Bld: 126 mg/dL — ABNORMAL HIGH (ref 70–99)
Potassium: 3.6 mEq/L (ref 3.5–5.1)
Sodium: 137 mEq/L (ref 135–145)
Total Bilirubin: 0.4 mg/dL (ref 0.2–1.2)
Total Protein: 6.7 g/dL (ref 6.0–8.3)

## 2015-01-23 LAB — LIPID PANEL
Cholesterol: 189 mg/dL (ref 0–200)
HDL: 35.8 mg/dL — ABNORMAL LOW (ref >39.00)
NonHDL: 153.2
Total CHOL/HDL Ratio: 5
Triglycerides: 215 mg/dL — ABNORMAL HIGH (ref 0.0–149.0)
VLDL: 43 mg/dL — ABNORMAL HIGH (ref 0.0–40.0)

## 2015-01-23 LAB — LDL CHOLESTEROL, DIRECT: Direct LDL: 123 mg/dL

## 2015-01-23 MED ORDER — METOPROLOL TARTRATE 25 MG PO TABS: 25.0000 mg | ORAL_TABLET | Freq: Two times a day (BID) | ORAL | Status: DC

## 2015-01-23 NOTE — Addendum Note (Signed)
Addended by: Tonita Phoenix on: 01/23/2015 08:02 AM   Modules accepted: Orders

## 2015-01-23 NOTE — Progress Notes (Signed)
Patient ID: Larry Rivera, male   DOB: [birth year], 49 y.o.   MRN: [id number]    HPI Larry Rivera returns today for followup. He is a pleasant 49 yo man with a h/o PVC's originating from the LV who underwent catheter ablation several  weeks ago.  In the interim he has done well. He had over 20,000 PVC's a day prior to ablation. He has not felt any PVC's since. On my exam today he did not have a PVC in over a hundred heart beats that I listened to. He denies chest pain or sob. He remains on his beta blocker for control of his blood pressure.   The patient underwent radiofrequency ablation of PVCs performed by Dr. Ladona Ridgel was successful and currently he has no palpitations no evidence of PVCs on his EKG. He is trying to lose weight in order to control his blood pressure medication until the ulcer started Atkins diet limitation of right foot and redness. He denies any presyncope or syncope, no chest pain or SOB.   01/23/2015 - 3 months follow up, the patient feels great, he has more energy, works full time with no limitations. NO LE edema, orthopnea, PND, no more palpitations, dizziness, no syncope. No chest pain, he continues to take metoprolol.  Allergies  Allergen Reactions  . Penicillins Other (See Comments)    Not sure, was told to not take .Marland Kitchen     Current Outpatient Prescriptions  Medication Sig Dispense Refill  . metoprolol tartrate (LOPRESSOR) 25 MG tablet Take 1 tablet (25 mg total) by mouth 2 (two) times daily. 180 tablet 3  . pravastatin (PRAVACHOL) 40 MG tablet Take 1 tablet (40 mg total) by mouth every evening. 90 tablet 4  . Psyllium (VEGETABLE LAXATIVE PO) Take 1 capsule by mouth daily as needed (CONSTIPATION).    Marland Kitchen vardenafil (LEVITRA) 10 MG tablet Take 0.5-1 tablets (5-10 mg total) by mouth daily as needed for erectile dysfunction. 15 tablet 3  . zolpidem (AMBIEN) 10 MG tablet Take 1 tablet (10 mg total) by mouth at bedtime as needed for sleep. 30 tablet 3   No current  facility-administered medications for this visit.     Past Medical History  Diagnosis Date  . Psychosexual dysfunction with inhibited sexual excitement   . Other and unspecified hyperlipidemia   . Paroxysmal ventricular tachycardia   . Insomnia, unspecified   . Allergic rhinitis due to pollen   . Unspecified part of closed fracture of clavicle     ROS:   All systems reviewed and negative except as noted in the HPI.   Past Surgical History  Procedure Laterality Date  . Cystectomy      Back of neck  . Rhinoplasty      Deviated septum  . Ablation  06/17/14    PVC ablation by Dr Ladona Ridgel - originating from the base of the left ventricle at approximately 12:00 just anterior to the mitral valve annulus  . V-tach ablation N/A 06/17/2014    Procedure: PVC ABLATION;  Surgeon: Marinus Maw, MD;  Location: [facility] CATH LAB;  Service: Cardiovascular;  Laterality: N/A;     Family History  Problem Relation Age of Onset  . Lung disease Maternal Grandmother   . Stroke Maternal Grandmother   . Rheum arthritis Mother   . Diabetes Mother     neuropathy  . Hyperlipidemia Father   . Diabetes Sister   . Cancer Neg Hx   . Heart disease Neg Hx  History   Social History  . Marital Status: Married    Spouse Name: N/A  . Number of Children: 1  . Years of Education: 12   Occupational History  . 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 Smoking status: Current Every Day Smoker -- 0.50 packs/day for 30 years    Types: Cigars  . Smokeless tobacco: Never Used     Comment: Cigar Only: not every day.  . Alcohol Use: Yes     Comment: rare drink  . Drug Use: No  . Sexual Activity:    Partners: Female   Other Topics Concern  . Not on file   Social History Narrative   HSG. Married '91. 1 dtr '95. Work - Naval architect - long haul.     BP 118/64 mmHg  Pulse 88  Ht  (1.854 m)  Wt 242 lb (109.77 kg)  BMI 31.93 kg/m2  Physical Exam:  Well appearing 49 yo man,  NAD HEENT: Unremarkable Neck:  No JVD, no thyromegally Lymphatics:  No adenopathy Back:  No CVA tenderness Lungs:  Clear with no wheezes HEART:  Regular rate rhythm, no murmurs, no rubs, no clicks Abd:  soft, positive bowel sounds, no organomegally, no rebound, no guarding Ext:  2 plus pulses, no edema, no cyanosis, no clubbing Skin:  No rashes no nodules Neuro:  CN II through XII intact, motor grossly intact  ECG - NSR  Echo: 05/19/2014 Left ventricle: Ventricular bigeminy makes evaluation of LVEF difficult. Overall, LVEF appears normal to mildly depressed No definite wall motion abnormalities. The cavity size was moderately dilated. Wall thickness was normal. Doppler parameters are consistent with abnormal left ventricular relaxation (grade 1 diastolic dysfunction). Aortic valve:  Structurally normal valve.  Cusp separation was normal. Doppler: Transvalvular velocity was within the normal range. There was no stenosis. There was no regurgitation. ------------------------------------------------------------------- Mitral valve:  Structurally normal valve.  Leaflet separation was normal. Doppler: Transvalvular velocity was within the normal range. There was no evidence for stenosis. There was no significant regurgitation.  Peak gradient (D): 2 mm Hg. ------------------------------------------------------------------- Left atrium: The atrium was normal in size. ------------------------------------------------------------------- Right ventricle: The cavity size was mildly dilated. Systolic function was normal.  TTE: 01/23/2015 - Left ventricle: The cavity size was normal. Wall thickness was increased in a pattern of mild LVH. Systolic function was normal. The estimated ejection fraction was in the range of 55% to 60%. Wall motion was normal; there were no regional wall motion abnormalities. Doppler parameters are consistent with abnormal left  ventricular relaxation (grade 1 diastolic dysfunction). The E/e&' ratio is <8, suggesting normal LV filling pressure. - Left atrium: Mildly dilated at 35 ml/m2. - Right atrium: The atrium was mildly dilated. - Inferior vena cava: The vessel was normal in size. The respirophasic diameter changes were in the normal range (>= 50%), consistent with normal central venous pressure.  Impressions: - LVEF 55-60%, mild LVH, normal wall motion, diastolic dysfunction, normal LV filling pressure, mild biatrial enlargement, normal IVC size.   Assess/Plan:   49 year old male  1. Frequent symptomatic monomorphic PVCs - 48 Hour Holter showed > 20.000 PVCs in 24 hours and he underwent RF ablation by Dr Ladona Ridgel in November 2015 with resolution of symptoms and PVCs, He is doing great post catheter ablation. No change in medical therapy.  His echo in 05/2014 showed mildly dilated LV with mildly depressed EF, today's echo shows normal left ventricular size and LVEF 60-65%. We will  continue metoprolol as his baseline HR remains in high 80'.  2. Hypertension - controlled, continue metoprolol.  3. Hyperlipidemia - elevated LDL and TAG, on pravastatin, we will repeat in 3 months as he is on a diet.  Follow up in 6 months.   Lars Masson 01/23/2015

## 2015-01-23 NOTE — Patient Instructions (Signed)
Medication Instructions:  None  Labwork: None  Testing/Procedures: None  Follow-Up: Your physician wants you to follow-up in: 6 months with Dr. Nelson.  You will receive a reminder letter in the mail two months in advance. If you don't receive a letter, please call our office to schedule the follow-up appointment.   Any Other Special Instructions Will Be Listed Below (If Applicable).   

## 2015-01-26 ENCOUNTER — Telehealth: Payer: Self-pay | Admitting: *Deleted

## 2015-01-26 MED ORDER — FISH OIL 1000 MG PO CAPS: 2000.0000 mg | ORAL_CAPSULE | Freq: Two times a day (BID) | ORAL | Status: DC

## 2015-01-26 NOTE — Telephone Encounter (Signed)
-----   Message from Lars Masson, MD sent at 01/26/2015  5:03 PM EDT ----- Larry Rivera, he has elevated TG, I would add fish oil 4gm per day to lower TG

## 2015-01-26 NOTE — Telephone Encounter (Signed)
Informed the pt that per Dr Delton See his lab results revealed that he has elevated triglycerides and she consulted with our pharmD Kennon Rounds, and both of them agreed that the pt to add fish oil 4 grams per day to lower his TG.  Confirmed the pharmacy of choice with the pt.  Pt verbalized understanding and agrees with this plan.

## 2015-02-25 ENCOUNTER — Other Ambulatory Visit: Payer: Self-pay | Admitting: Geriatric Medicine

## 2015-02-26 ENCOUNTER — Other Ambulatory Visit: Payer: Self-pay | Admitting: Internal Medicine

## 2015-02-26 MED ORDER — ZOLPIDEM TARTRATE 10 MG PO TABS: 10.0000 mg | ORAL_TABLET | Freq: Every evening | ORAL | Status: DC | PRN

## 2015-05-27 ENCOUNTER — Other Ambulatory Visit: Payer: Self-pay | Admitting: Cardiology

## 2015-07-07 ENCOUNTER — Telehealth: Payer: Self-pay

## 2015-07-07 NOTE — Telephone Encounter (Signed)
rx rf rq for zolpidem to [facility]

## 2015-07-08 MED ORDER — ZOLPIDEM TARTRATE 10 MG PO TABS: 10.0000 mg | ORAL_TABLET | Freq: Every evening | ORAL | Status: DC | PRN

## 2015-07-08 NOTE — Telephone Encounter (Signed)
rx faxed

## 2015-07-08 NOTE — Telephone Encounter (Signed)
Refilled and needs appointment for further refills.

## 2015-07-24 ENCOUNTER — Ambulatory Visit (INDEPENDENT_AMBULATORY_CARE_PROVIDER_SITE_OTHER): Payer: 59 | Admitting: Nurse Practitioner

## 2015-07-24 ENCOUNTER — Encounter: Payer: Self-pay | Admitting: Nurse Practitioner

## 2015-07-24 VITALS — BP 128/80 | HR 80 | Ht 74.0 in | Wt 249.4 lb

## 2015-07-24 DIAGNOSIS — I493 Ventricular premature depolarization: Secondary | ICD-10-CM

## 2015-07-24 DIAGNOSIS — I10 Essential (primary) hypertension: Secondary | ICD-10-CM

## 2015-07-24 NOTE — Patient Instructions (Addendum)
We will be checking the following labs today - NONE   Medication Instructions:    Continue with your current medicines.     Testing/Procedures To Be Arranged:  N/A  Follow-Up:   See Dr. Delton See in one year    Other Special Instructions:   Think about what we talked about today.     If you need a refill on your cardiac medications before your next appointment, please call your pharmacy.   Call the [facility] office at [phone] if you have any questions, problems or concerns.

## 2015-07-24 NOTE — Progress Notes (Signed)
CARDIOLOGY OFFICE NOTE  Date:  07/24/2015    [name] Date of Birth: 12-10-1965 Medical Record #[id number]  PCP:  Myrlene Broker, MD  Cardiologist:  Delton See    Chief Complaint  Patient presents with  . Follow up for PVC ablation    Seen for Dr. Delton See    History of Present Illness: Larry Rivera is a 49 y.o. male who presents today for a follow up visit. Seen for Dr. Delton See. He has had PVC/VT ablation in November 2015 by Dr. Ladona Ridgel. His last echo showed normal LV function. Other issues as noted below.   Last seen in June and was doing ok. EKG without PVCs noted.   Comes back today. Here alone. Feels "so much better" and "calmer". He drives a truck long distance. Asking about diet pills. Admits he could do better with his diet. No chest pain. Not short of breath. No palpitations. BP ok. He has gained 16 pounds this year.   Past Medical History  Diagnosis Date  . Psychosexual dysfunction with inhibited sexual excitement   . Other and unspecified hyperlipidemia   . Paroxysmal ventricular tachycardia (HCC)   . Insomnia, unspecified   . Allergic rhinitis due to pollen   . Unspecified part of closed fracture of clavicle     Past Surgical History  Procedure Laterality Date  . Cystectomy      Back of neck  . Rhinoplasty      Deviated septum  . Ablation  06/17/14    PVC ablation by Dr Ladona Ridgel - originating from the base of the left ventricle at approximately 12:00 just anterior to the mitral valve annulus  . V-tach ablation N/A 06/17/2014    Procedure: PVC ABLATION;  Surgeon: Marinus Maw, MD;  Location: [facility] CATH LAB;  Service: Cardiovascular;  Laterality: N/A;     Medications: Current Outpatient Prescriptions  Medication Sig Dispense Refill  . metoprolol tartrate (LOPRESSOR) 25 MG tablet Take 1 tablet (25 mg total) by mouth 2 (two) times daily. 180 tablet 3  . Omega-3 Fatty Acids (FISH OIL) 1000 MG CAPS Take 2 capsules (2,000 mg total) by mouth 2 (two) times  daily. 360 capsule 3  . pravastatin (PRAVACHOL) 40 MG tablet Take 1 tablet (40 mg total) by mouth every evening. 90 tablet 4  . Psyllium (VEGETABLE LAXATIVE PO) Take 1 capsule by mouth daily as needed (CONSTIPATION).    Marland Kitchen vardenafil (LEVITRA) 10 MG tablet Take 0.5-1 tablets (5-10 mg total) by mouth daily as needed for erectile dysfunction. 15 tablet 3  . zolpidem (AMBIEN) 10 MG tablet Take 1 tablet (10 mg total) by mouth at bedtime as needed for sleep. 30 tablet 1   No current facility-administered medications for this visit.    Allergies: Allergies  Allergen Reactions  . Penicillins Other (See Comments)    Not sure, was told to not take .Marland Kitchen    Social History: The patient  reports that he has been smoking Cigars.  He has never used smokeless tobacco. He reports that he drinks alcohol. He reports that he does not use illicit drugs.   Family History: The patient's family history includes Diabetes in his mother and sister; Hyperlipidemia in his father; Lung disease in his maternal grandmother; Rheum arthritis in his mother; Stroke in his maternal grandmother. There is no history of Cancer or Heart disease.   Review of Systems: Please see the history of present illness.   Otherwise, the review of systems is positive for  none.   All other systems are reviewed and negative.   Physical Exam: VS:  BP 128/80 mmHg  Pulse 80  Ht 6\' 2"  (1.88 m)  Wt 249 lb 6.4 oz (113.127 kg)  BMI 32.01 kg/m2 .  BMI Body mass index is 32.01 kg/(m^2).  Wt Readings from Last 3 Encounters:  07/24/15 249 lb 6.4 oz (113.127 kg)  01/23/15 242 lb (109.77 kg)  10/06/14 233 lb 12.8 oz (106.051 kg)    General: Pleasant. Well developed, well nourished and in no acute distress.  HEENT: Normal. Neck: Supple, no JVD, carotid bruits, or masses noted.  Cardiac: Regular rate and rhythm. No murmurs, rubs, or gallops. No edema.  Respiratory:  Lungs are clear to auscultation bilaterally with normal work of breathing.  GI:  Soft and nontender.  MS: No deformity or atrophy. Gait and ROM intact. Skin: Warm and dry. Color is normal.  Neuro:  Strength and sensation are intact and no gross focal deficits noted.  Psych: Alert, appropriate and with normal affect.   LABORATORY DATA:  EKG:  EKG is ordered today. This demonstrates NSR.  Lab Results  Component Value Date   WBC 11.8* 06/10/2014   HGB 15.2 06/10/2014   HCT 46.3 06/10/2014   PLT 269.0 06/10/2014   GLUCOSE 126* 01/23/2015   CHOL 189 01/23/2015   TRIG 215.0* 01/23/2015   HDL 35.80* 01/23/2015   LDLDIRECT 123.0 01/23/2015   LDLCALC 149* 07/21/2014   ALT 49 01/23/2015   AST 22 01/23/2015   NA 137 01/23/2015   K 3.6 01/23/2015   CL 105 01/23/2015   CREATININE 1.08 01/23/2015   BUN 10 01/23/2015   CO2 27 01/23/2015   TSH 1.36 05/05/2014   INR 0.9 06/10/2014   HGBA1C 6.5 07/21/2014    BNP (last 3 results) No results for input(s): BNP in the last 8760 hours.  ProBNP (last 3 results) No results for input(s): PROBNP in the last 8760 hours.   Other Studies Reviewed Today:  Echo Study Conclusions from June 2016  - Left ventricle: The cavity size was normal. Wall thickness was increased in a pattern of mild LVH. Systolic function was normal. The estimated ejection fraction was in the range of 55% to 60%. Wall motion was normal; there were no regional wall motion abnormalities. Doppler parameters are consistent with abnormal left ventricular relaxation (grade 1 diastolic dysfunction). The E/e&' ratio is <8, suggesting normal LV filling pressure. - Left atrium: Mildly dilated at 35 ml/m2. - Right atrium: The atrium was mildly dilated. - Inferior vena cava: The vessel was normal in size. The respirophasic diameter changes were in the normal range (>= 50%), consistent with normal central venous pressure.  Impressions:  - LVEF 55-60%, mild LVH, normal wall motion, diastolic dysfunction, normal LV filling pressure, mild  biatrial enlargement, normal IVC size.  Assessment/Plan: 1. Prior PVC/VT ablation - doing well clinically  2. HTN - BP ok on current regimen.   3. HLD - on statin therapy  4. Obesity - discussed at length. Encouraged to stay away from diet pills.   Current medicines are reviewed with the patient today.  The patient does not have concerns regarding medicines other than what has been noted above.  The following changes have been made:  See above.  Labs/ tests ordered today include:   No orders of the defined types were placed in this encounter.     Disposition:   FU with Dr. Delton See in 1 year.   Patient is agreeable to this  plan and will call if any problems develop in the interim.   Signed: Rosalio Macadamia, RN, ANP-C 07/24/2015 2:34 PM  [facility] [address] [place], [place]  [address] Phone: [phone] Fax: [phone]

## 2015-07-31 ENCOUNTER — Other Ambulatory Visit: Payer: Self-pay | Admitting: Internal Medicine

## 2015-07-31 NOTE — Telephone Encounter (Signed)
Faxed script back to [facility].../lmb 

## 2015-09-06 ENCOUNTER — Other Ambulatory Visit: Payer: Self-pay | Admitting: Internal Medicine

## 2015-09-30 ENCOUNTER — Other Ambulatory Visit: Payer: Self-pay | Admitting: Internal Medicine

## 2015-09-30 NOTE — Telephone Encounter (Signed)
Called and left patient a message informing him that we filled his Remus Loffler, but will need an ov for further refills.

## 2015-10-06 ENCOUNTER — Telehealth: Payer: Self-pay | Admitting: Internal Medicine

## 2015-10-06 ENCOUNTER — Other Ambulatory Visit: Payer: Self-pay | Admitting: Geriatric Medicine

## 2015-10-06 DIAGNOSIS — IMO0001 Reserved for inherently not codable concepts without codable children: Secondary | ICD-10-CM

## 2015-10-06 DIAGNOSIS — I493 Ventricular premature depolarization: Secondary | ICD-10-CM

## 2015-10-06 DIAGNOSIS — E785 Hyperlipidemia, unspecified: Secondary | ICD-10-CM

## 2015-10-06 DIAGNOSIS — R030 Elevated blood-pressure reading, without diagnosis of hypertension: Secondary | ICD-10-CM

## 2015-10-06 MED ORDER — PRAVASTATIN SODIUM 40 MG PO TABS: 40.0000 mg | ORAL_TABLET | Freq: Every evening | ORAL | Status: DC

## 2015-10-06 NOTE — Telephone Encounter (Signed)
Sent to pharmacy 

## 2015-10-06 NOTE — Telephone Encounter (Signed)
[facility] is requesting refill for pravastatin (PRAVACHOL) 40 MG tablet [[id number] He states he's been faxing this to [place] since beginning of feb

## 2015-10-24 ENCOUNTER — Other Ambulatory Visit: Payer: Self-pay | Admitting: Internal Medicine

## 2015-10-26 NOTE — Telephone Encounter (Signed)
Left patient a voice mail informing him that we are not able to refill his alprazolam until he schedules and office visit.

## 2015-10-26 NOTE — Telephone Encounter (Signed)
Denied, needs visit for refill as stated on last rx. No future appointment.

## 2015-11-22 ENCOUNTER — Other Ambulatory Visit: Payer: Self-pay | Admitting: Internal Medicine

## 2015-11-23 NOTE — Telephone Encounter (Signed)
Denied as needs visit.

## 2015-12-11 ENCOUNTER — Ambulatory Visit (INDEPENDENT_AMBULATORY_CARE_PROVIDER_SITE_OTHER): Payer: 59 | Admitting: Internal Medicine

## 2015-12-11 ENCOUNTER — Encounter: Payer: Self-pay | Admitting: Internal Medicine

## 2015-12-11 VITALS — BP 118/80 | HR 76 | Temp 98.4°F | Ht 74.0 in | Wt 248.0 lb

## 2015-12-11 DIAGNOSIS — Z0000 Encounter for general adult medical examination without abnormal findings: Secondary | ICD-10-CM

## 2015-12-11 DIAGNOSIS — G4700 Insomnia, unspecified: Secondary | ICD-10-CM

## 2015-12-11 DIAGNOSIS — Z720 Tobacco use: Secondary | ICD-10-CM

## 2015-12-11 DIAGNOSIS — I10 Essential (primary) hypertension: Secondary | ICD-10-CM

## 2015-12-11 MED ORDER — ZOLPIDEM TARTRATE 10 MG PO TABS: 10.0000 mg | ORAL_TABLET | Freq: Every evening | ORAL | Status: DC | PRN

## 2015-12-11 NOTE — Progress Notes (Signed)
Pre visit review using our clinic review tool, if applicable. No additional management support is needed unless otherwise documented below in the visit note. 

## 2015-12-11 NOTE — Patient Instructions (Signed)
We have sent in the refills of the ambien today and given you the note for that today.   Think about getting the colonoscopy done soon to protect yourself from getting colon cancer.   Health Maintenance, Male A healthy lifestyle and preventative care can promote health and wellness.  Maintain regular health, dental, and eye exams.  Eat a healthy diet. Foods like vegetables, fruits, whole grains, low-fat dairy products, and lean protein foods contain the nutrients you need and are low in calories. Decrease your intake of foods high in solid fats, added sugars, and salt. Get information about a proper diet from your health care provider, if necessary.  Regular physical exercise is one of the most important things you can do for your health. Most adults should get at least 150 minutes of moderate-intensity exercise (any activity that increases your heart rate and causes you to sweat) each week. In addition, most adults need muscle-strengthening exercises on 2 or more days a week.   Maintain a healthy weight. The body mass index (BMI) is a screening tool to identify possible weight problems. It provides an estimate of body fat based on height and weight. Your health care provider can find your BMI and can help you achieve or maintain a healthy weight. For males 20 years and older:  A BMI below 18.5 is considered underweight.  A BMI of 18.5 to 24.9 is normal.  A BMI of 25 to 29.9 is considered overweight.  A BMI of 30 and above is considered obese.  Maintain normal blood lipids and cholesterol by exercising and minimizing your intake of saturated fat. Eat a balanced diet with plenty of fruits and vegetables. Blood tests for lipids and cholesterol should begin at age 49 and be repeated every 5 years. If your lipid or cholesterol levels are high, you are over age 28, or you are at high risk for heart disease, you may need your cholesterol levels checked more frequently.Ongoing high lipid and  cholesterol levels should be treated with medicines if diet and exercise are not working.  If you smoke, find out from your health care provider how to quit. If you do not use tobacco, do not start.  Lung cancer screening is recommended for adults aged 55-80 years who are at high risk for developing lung cancer because of a history of smoking. A yearly low-dose CT scan of the lungs is recommended for people who have at least a 30-pack-year history of smoking and are current smokers or have quit within the past 15 years. A pack year of smoking is smoking an average of 1 pack of cigarettes a day for 1 year (for example, a 30-pack-year history of smoking could mean smoking 1 pack a day for 30 years or 2 packs a day for 15 years). Yearly screening should continue until the smoker has stopped smoking for at least 15 years. Yearly screening should be stopped for people who develop a health problem that would prevent them from having lung cancer treatment.  If you choose to drink alcohol, do not have more than 2 drinks per day. One drink is considered to be 12 oz (360 mL) of beer, 5 oz (150 mL) of wine, or 1.5 oz (45 mL) of liquor.  Avoid the use of street drugs. Do not share needles with anyone. Ask for help if you need support or instructions about stopping the use of drugs.  High blood pressure causes heart disease and increases the risk of stroke. High blood pressure  is more likely to develop in:  People who have blood pressure in the end of the normal range (100-139/85-89 mm Hg).  People who are overweight or obese.  People who are African American.  If you are 13-63 years of age, have your blood pressure checked every 3-5 years. If you are 85 years of age or older, have your blood pressure checked every year. You should have your blood pressure measured twice--once when you are at a hospital or clinic, and once when you are not at a hospital or clinic. Record the average of the two measurements. To  check your blood pressure when you are not at a hospital or clinic, you can use:  An automated blood pressure machine at a pharmacy.  A home blood pressure monitor.  If you are 24-60 years old, ask your health care provider if you should take aspirin to prevent heart disease.  Diabetes screening involves taking a blood sample to check your fasting blood sugar level. This should be done once every 3 years after age 89 if you are at a normal weight and without risk factors for diabetes. Testing should be considered at a younger age or be carried out more frequently if you are overweight and have at least 1 risk factor for diabetes.  Colorectal cancer can be detected and often prevented. Most routine colorectal cancer screening begins at the age of 47 and continues through age 63. However, your health care provider may recommend screening at an earlier age if you have risk factors for colon cancer. On a yearly basis, your health care provider may provide home test kits to check for hidden blood in the stool. A small camera at the end of a tube may be used to directly examine the colon (sigmoidoscopy or colonoscopy) to detect the earliest forms of colorectal cancer. Talk to your health care provider about this at age 56 when routine screening begins. A direct exam of the colon should be repeated every 5-10 years through age 63, unless early forms of precancerous polyps or small growths are found.  People who are at an increased risk for hepatitis B should be screened for this virus. You are considered at high risk for hepatitis B if:  You were born in a country where hepatitis B occurs often. Talk with your health care provider about which countries are considered high risk.  Your parents were born in a high-risk country and you have not received a shot to protect against hepatitis B (hepatitis B vaccine).  You have HIV or AIDS.  You use needles to inject street drugs.  You live with, or have sex  with, someone who has hepatitis B.  You are a man who has sex with other men (MSM).  You get hemodialysis treatment.  You take certain medicines for conditions like cancer, organ transplantation, and autoimmune conditions.  Hepatitis C blood testing is recommended for all people born from [birth year] through 1965 and any individual with known risk factors for hepatitis C.  Healthy men should no longer receive prostate-specific antigen (PSA) blood tests as part of routine cancer screening. Talk to your health care provider about prostate cancer screening.  Testicular cancer screening is not recommended for adolescents or adult males who have no symptoms. Screening includes self-exam, a health care provider exam, and other screening tests. Consult with your health care provider about any symptoms you have or any concerns you have about testicular cancer.  Practice safe sex. Use condoms and avoid high-risk  sexual practices to reduce the spread of sexually transmitted infections (STIs).  You should be screened for STIs, including gonorrhea and chlamydia if:  You are sexually active and are younger than 24 years.  You are older than 24 years, and your health care provider tells you that you are at risk for this type of infection.  Your sexual activity has changed since you were last screened, and you are at an increased risk for chlamydia or gonorrhea. Ask your health care provider if you are at risk.  If you are at risk of being infected with HIV, it is recommended that you take a prescription medicine daily to prevent HIV infection. This is called pre-exposure prophylaxis (PrEP). You are considered at risk if:  You are a man who has sex with other men (MSM).  You are a heterosexual man who is sexually active with multiple partners.  You take drugs by injection.  You are sexually active with a partner who has HIV.  Talk with your health care provider about whether you are at high risk of being  infected with HIV. If you choose to begin PrEP, you should first be tested for HIV. You should then be tested every 3 months for as long as you are taking PrEP.  Use sunscreen. Apply sunscreen liberally and repeatedly throughout the day. You should seek shade when your shadow is shorter than you. Protect yourself by wearing long sleeves, pants, a wide-brimmed hat, and sunglasses year round whenever you are outdoors.  Tell your health care provider of new moles or changes in moles, especially if there is a change in shape or color. Also, tell your health care provider if a mole is larger than the size of a pencil eraser.  A one-time screening for abdominal aortic aneurysm (AAA) and surgical repair of large AAAs by ultrasound is recommended for men aged 26-75 years who are current or former smokers.  Stay current with your vaccines (immunizations).   This information is not intended to replace advice given to you by your health care provider. Make sure you discuss any questions you have with your health care provider.   Document Released: 01/28/2008 Document Revised: 08/22/2014 Document Reviewed: 12/27/2010 Elsevier Interactive Patient Education [employer].

## 2015-12-12 ENCOUNTER — Encounter: Payer: Self-pay | Admitting: Internal Medicine

## 2015-12-12 DIAGNOSIS — Z720 Tobacco use: Secondary | ICD-10-CM | POA: Insufficient documentation

## 2015-12-12 NOTE — Assessment & Plan Note (Signed)
Declines immunizations today, talked to him about colonoscopy and he will need to do on vacation. Will call when ready for GI referral. Does not exercise and counseled on the need for that. Smokes cigars and talked to him about the risk and harm from that but he is not willing to quit today. Given screening recommendations.

## 2015-12-12 NOTE — Assessment & Plan Note (Signed)
Taking metoprolol and BP at goal. Adjust as needed, checking CMP today.

## 2015-12-12 NOTE — Progress Notes (Signed)
   Subjective:    Patient ID: Larry Rivera, male    DOB: October 06, 1965, 50 y.o.   MRN: [id number]  HPI The patient is a 50 YO man coming in for wellness. No new concerns.   PMH, [facility], social history reviewed and updated.   Review of Systems  Constitutional: Negative for fever, activity change, appetite change, fatigue and unexpected weight change.  HENT: Negative.   Eyes: Negative.   Respiratory: Negative for cough, chest tightness, shortness of breath and wheezing.   Cardiovascular: Negative for chest pain, palpitations and leg swelling.  Gastrointestinal: Negative for abdominal pain, diarrhea, constipation and abdominal distention.  Musculoskeletal: Negative.   Skin: Negative.   Neurological: Negative.   Psychiatric/Behavioral: Positive for sleep disturbance.      Objective:   Physical Exam  Constitutional: He is oriented to person, place, and time. He appears well-developed and well-nourished.  Overweight  HENT:  Head: Normocephalic and atraumatic.  Eyes: EOM are normal.  Neck: Normal range of motion.  Cardiovascular: Normal rate and regular rhythm.   Pulmonary/Chest: Effort normal and breath sounds normal. No respiratory distress. He has no wheezes. He has no rales.  Abdominal: Soft. Bowel sounds are normal. He exhibits no distension. There is no tenderness. There is no rebound.  Musculoskeletal: He exhibits no edema.  Neurological: He is alert and oriented to person, place, and time. Coordination normal.  Skin: Skin is warm and dry.   Filed Vitals:   12/11/15 1521  BP: 118/80  Pulse: 76  Temp: 98.4 F (36.9 C)  TempSrc: Oral  Height: 6\' 2"  (1.88 m)  Weight: 248 lb (112.492 kg)  SpO2: 97%      Assessment & Plan:

## 2015-12-12 NOTE — Assessment & Plan Note (Signed)
Reminded about the risks and harms of cigar abuse but he does not want to quit at this time.

## 2015-12-12 NOTE — Assessment & Plan Note (Signed)
Rx for [employer] refilled and note issued for his work about safe parameters for taking.

## 2015-12-18 ENCOUNTER — Other Ambulatory Visit (INDEPENDENT_AMBULATORY_CARE_PROVIDER_SITE_OTHER): Payer: 59

## 2015-12-18 DIAGNOSIS — R7989 Other specified abnormal findings of blood chemistry: Secondary | ICD-10-CM | POA: Diagnosis not present

## 2015-12-18 DIAGNOSIS — Z0000 Encounter for general adult medical examination without abnormal findings: Secondary | ICD-10-CM

## 2015-12-18 LAB — LIPID PANEL
CHOL/HDL RATIO: 7
CHOLESTEROL: 242 mg/dL — AB (ref 0–200)
HDL: 35.3 mg/dL — ABNORMAL LOW (ref >39.00)
NonHDL: 206.25
Triglycerides: 233 mg/dL — ABNORMAL HIGH (ref 0.0–149.0)
VLDL: 46.6 mg/dL — ABNORMAL HIGH (ref 0.0–40.0)

## 2015-12-18 LAB — COMPREHENSIVE METABOLIC PANEL
ALT: 38 U/L (ref 0–53)
AST: 19 U/L (ref 0–37)
Albumin: 4.3 g/dL (ref 3.5–5.2)
Alkaline Phosphatase: 74 U/L (ref 39–117)
BILIRUBIN TOTAL: 0.7 mg/dL (ref 0.2–1.2)
BUN: 8 mg/dL (ref 6–23)
CALCIUM: 9.2 mg/dL (ref 8.4–10.5)
CO2: 27 meq/L (ref 19–32)
CREATININE: 1.17 mg/dL (ref 0.40–1.50)
Chloride: 104 mEq/L (ref 96–112)
GFR: 70.06 mL/min (ref >60.00)
Glucose, Bld: 97 mg/dL (ref 70–99)
Potassium: 4.3 mEq/L (ref 3.5–5.1)
Sodium: 139 mEq/L (ref 135–145)
Total Protein: 7.1 g/dL (ref 6.0–8.3)

## 2015-12-18 LAB — CBC
HCT: 45 % (ref 39.0–52.0)
Hemoglobin: 15.4 g/dL (ref 13.0–17.0)
MCHC: 34.3 g/dL (ref 30.0–36.0)
MCV: 93.1 fl (ref 78.0–100.0)
Platelets: 367 10*3/uL (ref 150.0–400.0)
RBC: 4.84 Mil/uL (ref 4.22–5.81)
RDW: 15.3 % (ref 11.5–15.5)
WBC: 10.1 10*3/uL (ref 4.0–10.5)

## 2015-12-18 LAB — LDL CHOLESTEROL, DIRECT: LDL DIRECT: 169 mg/dL

## 2016-01-06 ENCOUNTER — Telehealth: Payer: Self-pay | Admitting: *Deleted

## 2016-01-06 DIAGNOSIS — R030 Elevated blood-pressure reading, without diagnosis of hypertension: Secondary | ICD-10-CM

## 2016-01-06 DIAGNOSIS — I493 Ventricular premature depolarization: Secondary | ICD-10-CM

## 2016-01-06 DIAGNOSIS — E785 Hyperlipidemia, unspecified: Secondary | ICD-10-CM

## 2016-01-06 DIAGNOSIS — IMO0001 Reserved for inherently not codable concepts without codable children: Secondary | ICD-10-CM

## 2016-01-06 MED ORDER — PRAVASTATIN SODIUM 40 MG PO TABS: 40.0000 mg | ORAL_TABLET | Freq: Every evening | ORAL | Status: DC

## 2016-01-06 MED ORDER — METOPROLOL TARTRATE 25 MG PO TABS: ORAL_TABLET | ORAL | Status: DC

## 2016-01-06 NOTE — Telephone Encounter (Signed)
Prescriptions (maintenence meds) was sent to [facility]. Just waiting on approval for ambien...Larry Rivera

## 2016-01-06 NOTE — Telephone Encounter (Signed)
Pt stated [facility] has it ready for him to pick up there but he is NOT going to pick it up there if we can res[facility] to [facility] instead. Please advise.

## 2016-01-06 NOTE — Telephone Encounter (Signed)
Left msg on triage stating he can no longer use [facility] needing his prescriptions sent to [facility]. Sent maintenance meds pls advise on ambien....Raechel Chute

## 2016-01-07 MED ORDER — ZOLPIDEM TARTRATE 10 MG PO TABS: 10.0000 mg | ORAL_TABLET | Freq: Every evening | ORAL | Status: DC | PRN

## 2016-01-07 NOTE — Telephone Encounter (Signed)
Rx printed and signed, please call [facility] and cancel remaining refills.

## 2016-01-07 NOTE — Telephone Encounter (Signed)
Notified [facility].../LMB

## 2016-01-21 ENCOUNTER — Other Ambulatory Visit: Payer: Self-pay | Admitting: Internal Medicine

## 2016-01-21 MED ORDER — ROSUVASTATIN CALCIUM 20 MG PO TABS: 20.0000 mg | ORAL_TABLET | Freq: Every day | ORAL | Status: DC

## 2016-06-27 ENCOUNTER — Other Ambulatory Visit: Payer: Self-pay | Admitting: Internal Medicine

## 2016-06-27 NOTE — Telephone Encounter (Signed)
Sent to pharmacy 

## 2016-07-27 ENCOUNTER — Ambulatory Visit (INDEPENDENT_AMBULATORY_CARE_PROVIDER_SITE_OTHER): Payer: 59 | Admitting: Cardiology

## 2016-07-27 ENCOUNTER — Encounter: Payer: Self-pay | Admitting: Cardiology

## 2016-07-27 ENCOUNTER — Encounter (INDEPENDENT_AMBULATORY_CARE_PROVIDER_SITE_OTHER): Payer: Self-pay

## 2016-07-27 VITALS — BP 124/82 | HR 59 | Ht 74.0 in | Wt 256.0 lb

## 2016-07-27 DIAGNOSIS — E782 Mixed hyperlipidemia: Secondary | ICD-10-CM

## 2016-07-27 DIAGNOSIS — I428 Other cardiomyopathies: Secondary | ICD-10-CM

## 2016-07-27 DIAGNOSIS — E784 Other hyperlipidemia: Secondary | ICD-10-CM

## 2016-07-27 DIAGNOSIS — I10 Essential (primary) hypertension: Secondary | ICD-10-CM

## 2016-07-27 DIAGNOSIS — I493 Ventricular premature depolarization: Secondary | ICD-10-CM

## 2016-07-27 DIAGNOSIS — E7849 Other hyperlipidemia: Secondary | ICD-10-CM

## 2016-07-27 NOTE — Patient Instructions (Addendum)
Medication Instructions:   Your physician recommends that you continue on your current medications as directed. Please refer to the Current Medication list given to you today.    Labwork:  NEXT WEEK SOMETIME TO CHECK A ---CMET, CBC W DIFF, AND LIPIDS---PLEASE COME FASTING TO THIS LAB APPOINTMENT     Follow-Up:  Your physician wants you to follow-up in: 6 MONTHS WITH DR Johnell Comings will receive a reminder letter in the mail two months in advance. If you don't receive a letter, please call our office to schedule the follow-up appointment.        If you need a refill on your cardiac medications before your next appointment, please call your pharmacy.

## 2016-07-27 NOTE — Progress Notes (Signed)
CARDIOLOGY OFFICE NOTE  Date:  07/27/2016    Larry Bristleavid S Rivera Date of Birth: 12/07/1965 Medical Record #[id number]  PCP:  Myrlene BrokerElizabeth A Crawford, MD  Cardiologist:  Daisey MustNelson    No chief complaint on file.   History of Present Illness: Larry BristleDavid S Tomer is a [age] y.o. male who presents today for a follow up visit. Seen for Dr. Delton SeeNelson. He has had PVC/VT ablation in November 2015 by Dr. Ladona Ridgelaylor. His last echo showed normal LV function. Other issues as noted below.   Last seen in June and was doing ok. EKG without PVCs noted.   Comes back today. Here alone. Feels "so much better" and "calmer". He drives a truck long distance. Asking about diet pills. Admits he could do better with his diet. No chest pain. Not short of breath. No palpitations. BP ok. He has gained 16 pounds this year.   07/27/2016, this is one year follow-up, this is a very pleasant patient who keeps reinforcing, change been made to his life. He is completely asymptomatic he has no chest pain shortness of breath no further palpitations dizziness or syncope. He is tolerating Crestor very well, but previously didn't tolerate Lipitor as he had significant joint pains. Denies lower extremity edema or claudications.  Past Medical History:  Diagnosis Date  . Allergic rhinitis due to pollen   . Insomnia, unspecified   . Other and unspecified hyperlipidemia   . Paroxysmal ventricular tachycardia (HCC)   . Psychosexual dysfunction with inhibited sexual excitement   . Unspecified part of closed fracture of clavicle     Past Surgical History:  Procedure Laterality Date  . ABLATION  06/17/14   PVC ablation by Dr Ladona Ridgelaylor - originating from the base of the left ventricle at approximately 12:00 just anterior to the mitral valve annulus  . CYSTECTOMY     Back of neck  . RHINOPLASTY     Deviated septum  . V-TACH ABLATION N/A 06/17/2014   Procedure: PVC ABLATION;  Surgeon: Marinus MawGregg W Taylor, MD;  [facility];  Service: Cardiovascular;   Laterality: N/A;     Medications: Current Outpatient Prescriptions  Medication Sig Dispense Refill  . metoprolol tartrate (LOPRESSOR) 25 MG tablet TAKE 1 TABLET (25 MG TOTAL) BY MOUTH 2 (TWO) TIMES DAILY. 180 tablet 3  . Psyllium (VEGETABLE LAXATIVE PO) Take 1 capsule by mouth daily as needed (CONSTIPATION).    Marland Kitchen. rosuvastatin (CRESTOR) 20 MG tablet Take 1 tablet (20 mg total) by mouth daily. 90 tablet 3  . vardenafil (LEVITRA) 10 MG tablet Take 0.5-1 tablets (5-10 mg total) by mouth daily as needed for erectile dysfunction. 15 tablet 3  . zolpidem (AMBIEN) 10 MG tablet TAKE 1 TABLET BY MOUTH AT BEDTIME AS NEEDED FOR SLEEP 30 tablet 3  . Omega-3 Fatty Acids (FISH OIL) 1000 MG CAPS Take 2 capsules (2,000 mg total) by mouth 2 (two) times daily. (Patient not taking: Reported on 07/27/2016) 360 capsule 3   No current facility-administered medications for this visit.     Allergies: Allergies  Allergen Reactions  . Penicillins Other (See Comments)    Not sure, was told to not take .Marland Kitchen.    Social History: The patient  reports that he has been smoking Cigars.  He has a 15.00 pack-year smoking history. He has never used smokeless tobacco. He reports that he drinks alcohol. He reports that he does not use drugs.   Family History: The patient's family history includes Diabetes in his mother and  sister; Hyperlipidemia in his father; Lung disease in his maternal grandmother; Rheum arthritis in his mother; Stroke in his maternal grandmother.   Review of Systems: Please see the history of present illness.   Otherwise, the review of systems is positive for none.   All other systems are reviewed and negative.   Physical Exam: VS:  BP 124/82   Pulse (!) 59   Ht 6\' 2"  (1.88 m)   Wt 256 lb (116.1 kg)   BMI 32.87 kg/m  .  BMI Body mass index is 32.87 kg/m.  Wt Readings from Last 3 Encounters:  07/27/16 256 lb (116.1 kg)  12/11/15 248 lb (112.5 kg)  07/24/15 249 lb 6.4 oz (113.1 kg)     General: Pleasant. Well developed, well nourished and in no acute distress.   HEENT: Normal.  Neck: Supple, no JVD, carotid bruits, or masses noted.  Cardiac: Regular rate and rhythm. No murmurs, rubs, or gallops. No edema.  Respiratory:  Lungs are clear to auscultation bilaterally with normal work of breathing.  GI: Soft and nontender.  MS: No deformity or atrophy. Gait and ROM intact.  Skin: Warm and dry. Color is normal.  Neuro:  Strength and sensation are intact and no gross focal deficits noted.  Psych: Alert, appropriate and with normal affect.   LABORATORY DATA:  EKG:  EKG is ordered today. This demonstrates NSR.  Lab Results  Component Value Date   WBC 10.1 12/18/2015   HGB 15.4 12/18/2015   HCT 45.0 12/18/2015   PLT 367.0 12/18/2015   GLUCOSE 97 12/18/2015   CHOL 242 (H) 12/18/2015   TRIG 233.0 (H) 12/18/2015   HDL 35.30 (L) 12/18/2015   LDLDIRECT 169.0 12/18/2015   LDLCALC 149 (H) 07/21/2014   ALT 38 12/18/2015   AST 19 12/18/2015   NA 139 12/18/2015   K 4.3 12/18/2015   CL 104 12/18/2015   CREATININE 1.17 12/18/2015   BUN 8 12/18/2015   CO2 27 12/18/2015   TSH 1.36 05/05/2014   INR 0.9 06/10/2014   HGBA1C 6.5 07/21/2014    BNP (last 3 results) No results for input(s): BNP in the last 8760 hours.  ProBNP (last 3 results) No results for input(s): PROBNP in the last 8760 hours.   Other Studies Reviewed Today:  Echo Study Conclusions from June 2016  - Left ventricle: The cavity size was normal. Wall thickness was increased in a pattern of mild LVH. Systolic function was normal. The estimated ejection fraction was in the range of 55% to 60%. Wall motion was normal; there were no regional wall motion abnormalities. Doppler parameters are consistent with abnormal left ventricular relaxation (grade 1 diastolic dysfunction). The E/e&' ratio is <8, suggesting normal LV filling pressure. - Left atrium: Mildly dilated at 35 ml/m2. - Right  atrium: The atrium was mildly dilated. - Inferior vena cava: The vessel was normal in size. The respirophasic diameter changes were in the normal range (>= 50%), consistent with normal central venous pressure.  Impressions:  - LVEF 55-60%, mild LVH, normal wall motion, diastolic dysfunction, normal LV filling pressure, mild biatrial enlargement, normal IVC size.  EKG performed today 07/27/2016 shows sinus bradycardia with ventricular rate 59 otherwise normal EKG.   Assessment/Plan:  1. Prior PVC/VT ablation - doing well clinically, He is completely asymptomatic, he is no palpitations dizziness or syncope.  2. HTN - well controlled on current regimen.   3. HLD - mixed, significantly elevated, with triglyceride 233 and LDL 169 in May 2017, he was  started on Lipitor that he didn't tolerate, currently tolerating well Crestor 20 mg daily, we will recheck his lipids and CMP next week as he already ate today.  4. Obesity - discussed at length. Encouraged walking.  Current medicines are reviewed with the patient today.  The patient does not have concerns regarding medicines other than what has been noted above.  The following changes have been made:  See above.  Labs/ tests ordered today include:   No orders of the defined types were placed in this encounter.  Disposition:   FU with Dr. Delton See in 6 months.  Patient is agreeable to this plan and will call if any problems develop in the interim.   Signed: Rosalio Macadamia, RN, ANP-C 07/27/2016 9:58 AM  [facility] [address] [place], [place]  [address] Phone: [phone] Fax: [phone]

## 2016-08-17 ENCOUNTER — Other Ambulatory Visit: Payer: 59 | Admitting: *Deleted

## 2016-08-17 DIAGNOSIS — I493 Ventricular premature depolarization: Secondary | ICD-10-CM

## 2016-08-17 DIAGNOSIS — I10 Essential (primary) hypertension: Secondary | ICD-10-CM

## 2016-08-17 DIAGNOSIS — E7800 Pure hypercholesterolemia, unspecified: Secondary | ICD-10-CM | POA: Diagnosis not present

## 2016-08-18 ENCOUNTER — Telehealth: Payer: Self-pay | Admitting: *Deleted

## 2016-08-18 LAB — CBC WITH DIFFERENTIAL/PLATELET
Basophils Absolute: 0 10*3/uL (ref 0.0–0.2)
Basos: 0 %
EOS (ABSOLUTE): 0.1 10*3/uL (ref 0.0–0.4)
Eos: 1 %
Hematocrit: 44.9 % (ref 37.5–51.0)
Hemoglobin: 15.1 g/dL (ref 13.0–17.7)
Immature Grans (Abs): 0 10*3/uL (ref 0.0–0.1)
Immature Granulocytes: 0 %
Lymphocytes Absolute: 2.1 10*3/uL (ref 0.7–3.1)
Lymphs: 25 %
MCH: 32.1 pg (ref 26.6–33.0)
MCHC: 33.6 g/dL (ref 31.5–35.7)
MCV: 96 fL (ref 79–97)
Monocytes Absolute: 0.5 10*3/uL (ref 0.1–0.9)
Monocytes: 6 %
Neutrophils Absolute: 5.6 10*3/uL (ref 1.4–7.0)
Neutrophils: 68 %
Platelets: 316 10*3/uL (ref 150–379)
RBC: 4.7 x10E6/uL (ref 4.14–5.80)
RDW: 14 % (ref 12.3–15.4)
WBC: 8.3 10*3/uL (ref 3.4–10.8)

## 2016-08-18 LAB — COMPREHENSIVE METABOLIC PANEL
ALT: 44 IU/L (ref 0–44)
AST: 18 IU/L (ref 0–40)
Albumin/Globulin Ratio: 1.7 (ref 1.2–2.2)
Albumin: 4.3 g/dL (ref 3.5–5.5)
Alkaline Phosphatase: 84 IU/L (ref 39–117)
BUN/Creatinine Ratio: 6 — ABNORMAL LOW (ref 9–20)
BUN: 7 mg/dL (ref 6–24)
Bilirubin Total: 0.5 mg/dL (ref 0.0–1.2)
CO2: 22 mmol/L (ref 18–29)
Calcium: 9.5 mg/dL (ref 8.7–10.2)
Chloride: 101 mmol/L (ref 96–106)
Creatinine, Ser: 1.14 mg/dL (ref 0.76–1.27)
GFR calc Af Amer: 86 mL/min/{1.73_m2} (ref >59)
GFR calc non Af Amer: 75 mL/min/{1.73_m2} (ref >59)
Globulin, Total: 2.6 g/dL (ref 1.5–4.5)
Glucose: 117 mg/dL — ABNORMAL HIGH (ref 65–99)
Potassium: 4.4 mmol/L (ref 3.5–5.2)
Sodium: 141 mmol/L (ref 134–144)
Total Protein: 6.9 g/dL (ref 6.0–8.5)

## 2016-08-18 LAB — LIPID PANEL
Chol/HDL Ratio: 4.9 ratio units (ref 0.0–5.0)
Cholesterol, Total: 170 mg/dL (ref 100–199)
HDL: 35 mg/dL — ABNORMAL LOW (ref >39)
LDL Calculated: 89 mg/dL (ref 0–99)
Triglycerides: 229 mg/dL — ABNORMAL HIGH (ref 0–149)
VLDL Cholesterol Cal: 46 mg/dL — ABNORMAL HIGH (ref 5–40)

## 2016-08-18 MED ORDER — FISH OIL 1000 MG PO CAPS
2000.0000 mg | ORAL_CAPSULE | Freq: Two times a day (BID) | ORAL | 3 refills | Status: DC
Start: 2016-08-18 — End: 2017-02-01

## 2016-08-18 NOTE — Telephone Encounter (Signed)
Notified the pt that per Dr Delton See, she recommends that he restart his fish oil, as previously prescribed.  Resent this prescription to the pts pharmacy of choice.  Pt verbalized understanding and agrees with this plan.

## 2016-08-18 NOTE — Telephone Encounter (Signed)
-----   Message from Lars Masson, MD sent at 08/18/2016  1:14 PM EST ----- I would restart fish oil as previously prescribed.   ----- Message ----- From: Loa Socks, LPN Sent: 0/10/90   9:43 AM To: Lars Masson, MD  Spoke with the pt and went over lab results per Dr Delton See.  Asked the pt if he was taking fish oil, as this was listed on his med list.  Pt states he has not been taking his fish oil, only his statin.  Informed the pt that I will update Dr Delton See on this, and follow-up with him once full recommendations are given.  Pt verbalized understanding and agrees with this plan.

## 2016-09-04 IMAGING — MR MR CARD MORPHOLOGY WO/W CM
10 of 11 series · 38 of 40 positions shown · IV contrast (Yes   MH)
Comparison: none

CLINICAL DATA: 49-year-old male with frequent PVCs - 20.000 in 24
hours, s/p RF ablation with moderately depressed LVEF on
echocardiogram. Evaluate for LVEF post treatment and for possible
structural heart disease.

EXAM:
CARDIAC MRI
TECHNIQUE: The patient was scanned on a 1.5 Tesla GE magnet. A dedicated
cardiac coil was used. Functional imaging was done using Fiesta
sequences. [DATE], and 4 chamber views were done to assess for RWMA's.
Modified Jim rule using a short axis stack was used to
calculate an ejection fraction on a dedicated work station using
Circle software. The patient received 40 cc of Multihance. After 10
minutes inversion recovery sequences were used to assess for
infiltration and scar tissue.
CONTRAST:  40 cc  of Multihance

[Series 3: bSSFP · sagittal · 8.0mm · 1.64mm/px · 1 of 14 slices shown (1 of 4)]
[im 1/14]
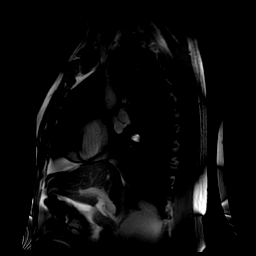

[Series 4: bSSFP · axial · 8.0mm · 1.45mm/px · 1 of 20 slices shown (2 of 4)]
[im 1/20]
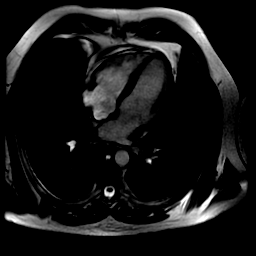

[Series 5: bSSFP · oblique · 8.0mm · 1.37mm/px · 23 of 360 slices shown (3 of 4)]
[im 1/360]
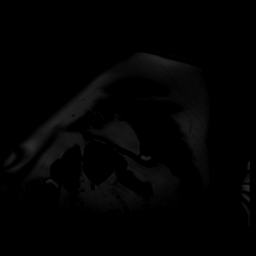
[im 17/360]
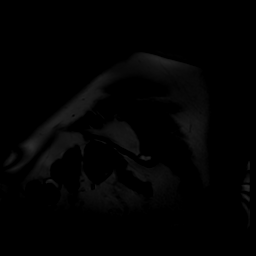
[im 33/360]
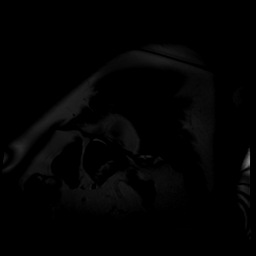
[im 49/360]
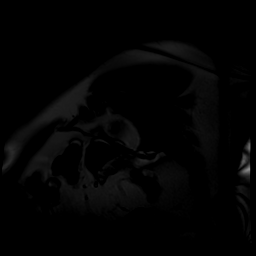
[im 66/360]
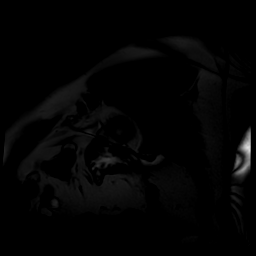
[im 82/360]
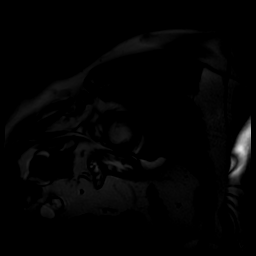
[im 98/360]
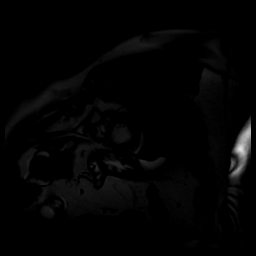
[im 115/360]
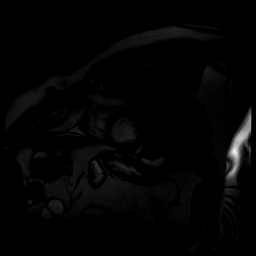
[im 131/360]
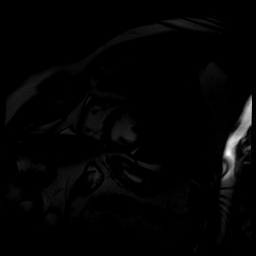
[im 147/360]
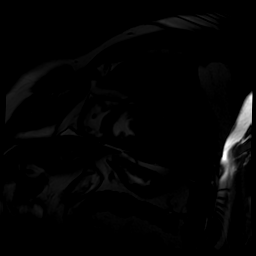
[im 164/360]
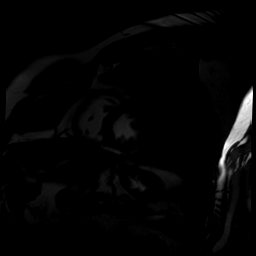
[im 180/360]
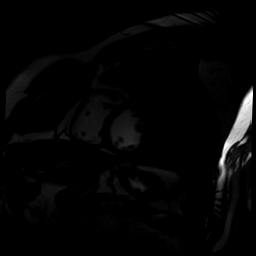
[im 196/360]
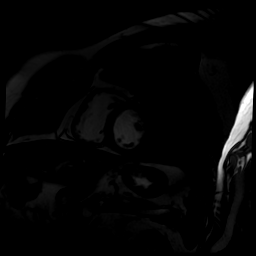
[im 213/360]
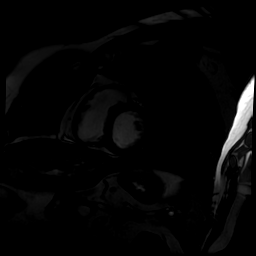
[im 229/360]
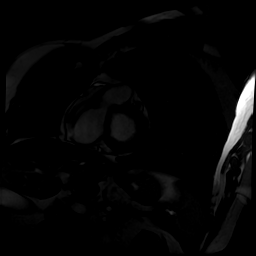
[im 245/360]
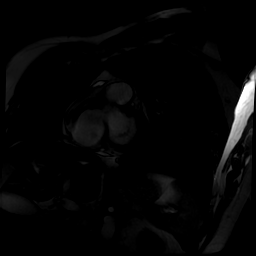
[im 262/360]
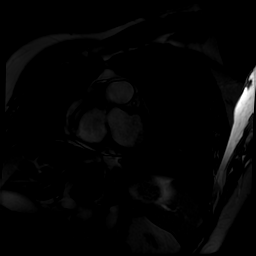
[im 278/360]
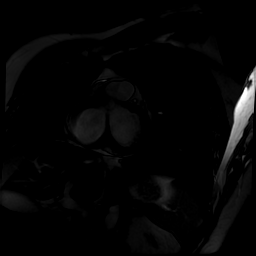
[im 294/360]
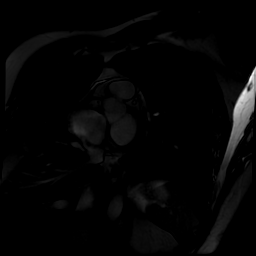
[im 311/360]
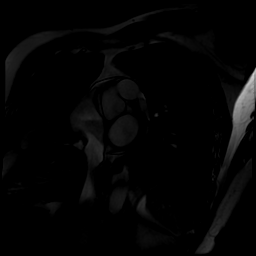
[im 327/360]
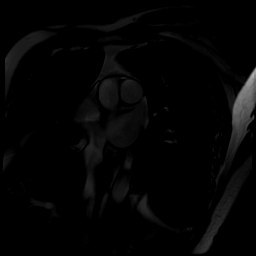
[im 343/360]
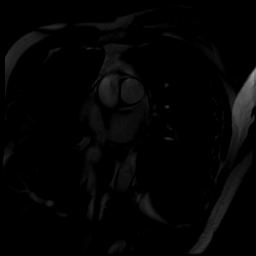
[im 360/360]
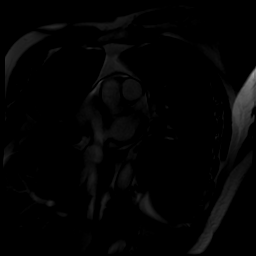

[Series 6: bSSFP · axial · 8.0mm · 1.45mm/px · z∈[-149,+50]mm · 3 of 60 slices shown (4 of 4)]
[im 1/60]
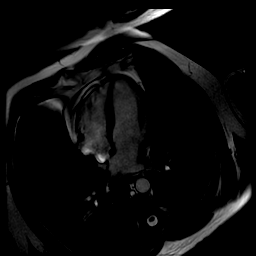
[im 30/60]
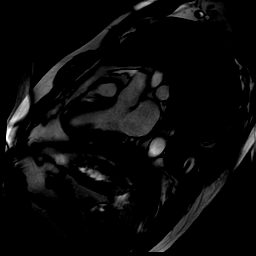
[im 60/60]
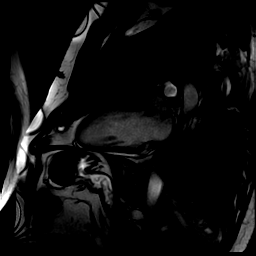

[Series 7: fast cine (valvular · axial · 8.0mm · 1.45mm/px · z∈[-149,+50]mm · 4 of 60 slices shown]
[im 1/60]
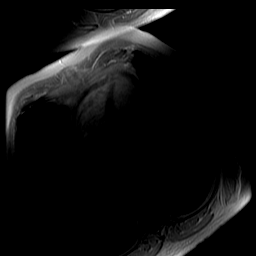
[im 20/60]
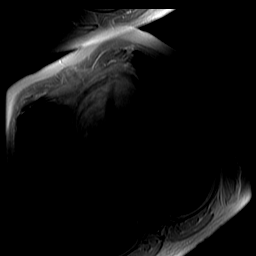
[im 40/60]
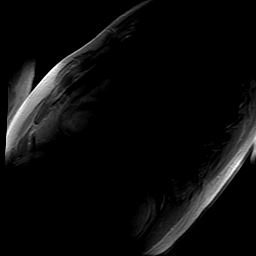
[im 60/60]
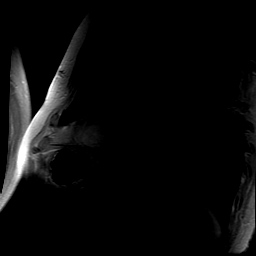

[Series 8: cine ir · oblique · 10.0mm · 1.64mm/px · 2 of 30 slices shown]
[im 1/30]
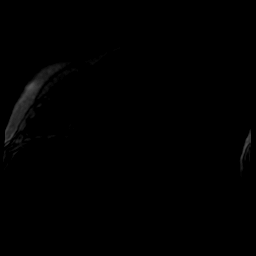
[im 30/30]
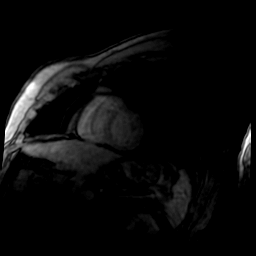

[Series 11: delayed ir prep · oblique · 8.0mm · 1.64mm/px · 1 of 17 slices shown (1 of 2)]
[im 1/17]
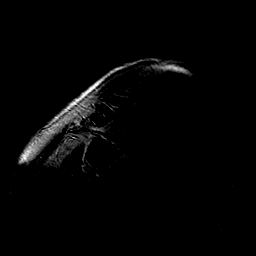

[Series 12: delayed ir prep · oblique · 8.0mm · 1.64mm/px · 1 of 6 slices shown (2 of 2)]
[im 1/6]
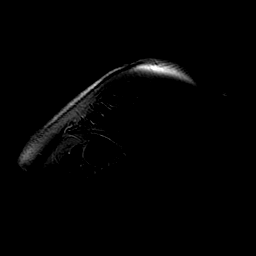

[Series 13: rad delayed ir · axial · 8.0mm · 1.45mm/px · 1 of 3 slices shown (1 of 2)]
[im 1/3]
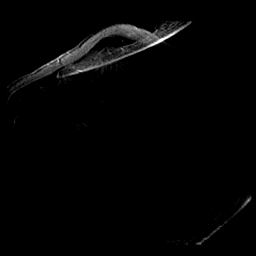

[Series 14: rad delayed ir · oblique · 8.0mm · 1.45mm/px · 1 of 3 slices shown (2 of 2)]
[im 1/3]
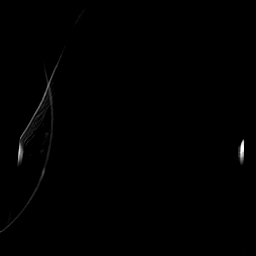

[38 of 40 positions shown; findings below may reference images not displayed]

FINDINGS: 1. Mildly dilated left ventricle with normal thickness and normal
systolic function (LVEF = 60%).

No regional wall motion abnormalities.

LVEDD:  53 mm

LVESD:  32 mm

LVEDV:  153 ml

LVESV:  61 ml

SV:  92 ml

CO:  6.5 L/minute

Myocardial mass:  158 g

2. Normal right ventricular size, thickness and systolic function
(RVEF = 50%). No regional wall motion abnormalities.

RVEDV:  183 ml

RVESV:  92 ml

SV:  91 ml

CO:  6.5 L/minute

3.  Mild left atrial dilatation.

4.  Mild mitral regurgitation, trivial tricuspid regurgitation.

5. No evidence of late gadolinium enhancement in the myocardium of
the left ventricle.

6.  Normal caliber of the aortic root, aorta and pulmonary artery.
IMPRESSION: 1. Mildly dilated left ventricle with normal thickness and normal
systolic function (LVEF = 60%).

No regional wall motion abnormalities.

2. Normal right ventricular size, thickness and systolic function
(RVEF = 50%). No regional wall motion abnormalities.

3.  Mild left atrial dilatation.

4.  Mild mitral regurgitation, trivial tricuspid regurgitation.

5. No evidence of late gadolinium enhancement in the myocardium of
the left ventricle.

Jeferson Aguayo

## 2016-10-17 ENCOUNTER — Other Ambulatory Visit: Payer: Self-pay | Admitting: Internal Medicine

## 2017-01-13 ENCOUNTER — Telehealth: Payer: Self-pay | Admitting: Internal Medicine

## 2017-01-24 MED ORDER — ZOLPIDEM TARTRATE 10 MG PO TABS: 10.0000 mg | ORAL_TABLET | Freq: Every evening | ORAL | 0 refills | Status: DC | PRN

## 2017-01-24 NOTE — Telephone Encounter (Signed)
10 tab faxed to [facility], make sure pt keep his appt for further refill.

## 2017-01-24 NOTE — Telephone Encounter (Signed)
Pt called in and made an appt for next wed with charlotte for his cpe.  He wants to know if she can refill his Ambein now because he is completely out.  [facility] [address]

## 2017-02-01 ENCOUNTER — Encounter: Payer: Self-pay | Admitting: Nurse Practitioner

## 2017-02-01 ENCOUNTER — Ambulatory Visit (INDEPENDENT_AMBULATORY_CARE_PROVIDER_SITE_OTHER): Payer: 59 | Admitting: Nurse Practitioner

## 2017-02-01 VITALS — BP 122/80 | HR 67 | Temp 98.3°F | Ht 74.0 in | Wt 239.0 lb

## 2017-02-01 DIAGNOSIS — I10 Essential (primary) hypertension: Secondary | ICD-10-CM | POA: Diagnosis not present

## 2017-02-01 DIAGNOSIS — E7800 Pure hypercholesterolemia, unspecified: Secondary | ICD-10-CM

## 2017-02-01 DIAGNOSIS — G4700 Insomnia, unspecified: Secondary | ICD-10-CM

## 2017-02-01 MED ORDER — ROSUVASTATIN CALCIUM 20 MG PO TABS: 20.0000 mg | ORAL_TABLET | Freq: Every day | ORAL | 3 refills | Status: DC

## 2017-02-01 MED ORDER — FISH OIL 1000 MG PO CAPS: 2000.0000 mg | ORAL_CAPSULE | Freq: Two times a day (BID) | ORAL | 3 refills | Status: AC

## 2017-02-01 MED ORDER — ZOLPIDEM TARTRATE 10 MG PO TABS: 10.0000 mg | ORAL_TABLET | Freq: Every evening | ORAL | 5 refills | Status: DC | PRN

## 2017-02-01 MED ORDER — METOPROLOL TARTRATE 25 MG PO TABS: ORAL_TABLET | ORAL | 3 refills | Status: DC

## 2017-02-01 NOTE — Progress Notes (Signed)
Subjective:  Patient ID: Larry Rivera, male    DOB: [birth year]  Age: 51 y.o. MRN: [id number]  CC: Follow-up (med refill, does not want CPE)   HPI   Hyperlipidemia: No adverse effects with crestor.  Insominia: No adver effects with ambien.  HTN: stable with metoprolol. No palpitations or ectopic beats.  Outpatient Medications Prior to Visit  Medication Sig Dispense Refill  . vardenafil (LEVITRA) 10 MG tablet Take 0.5-1 tablets (5-10 mg total) by mouth daily as needed for erectile dysfunction. 15 tablet 3  . metoprolol tartrate (LOPRESSOR) 25 MG tablet TAKE 1 TABLET (25 MG TOTAL) BY MOUTH 2 (TWO) TIMES DAILY. 180 tablet 3  . Omega-3 Fatty Acids (FISH OIL) 1000 MG CAPS Take 2 capsules (2,000 mg total) by mouth 2 (two) times daily. 360 capsule 3  . rosuvastatin (CRESTOR) 20 MG tablet Take 1 tablet (20 mg total) by mouth daily. 90 tablet 3  . zolpidem (AMBIEN) 10 MG tablet Take 1 tablet (10 mg total) by mouth at bedtime as needed. Keep appt for future refills. 10 tablet 0  . Psyllium (VEGETABLE LAXATIVE PO) Take 1 capsule by mouth daily as needed (CONSTIPATION).     No facility-administered medications prior to visit.     ROS See HPI  Objective:  BP 122/80   Pulse 67   Temp 98.3 F (36.8 C)   Ht 6\' 2"  (1.88 m)   Wt 239 lb (108.4 kg)   SpO2 99%   BMI 30.69 kg/m   BP Readings from Last 3 Encounters:  02/01/17 122/80  07/27/16 124/82  12/11/15 118/80    Wt Readings from Last 3 Encounters:  02/01/17 239 lb (108.4 kg)  07/27/16 256 lb (116.1 kg)  12/11/15 248 lb (112.5 kg)    Physical Exam  Constitutional: He is oriented to person, place, and time. No distress.  HENT:  Right Ear: External ear normal.  Left Ear: External ear normal.  Nose: Nose normal.  Mouth/Throat: Oropharynx is clear and moist. No oropharyngeal exudate.  Neck: Normal range of motion. Neck supple.  Cardiovascular: Normal rate, regular rhythm and normal heart sounds.   No murmur  heard. Pulmonary/Chest: Effort normal and breath sounds normal.  Abdominal: Soft.  Musculoskeletal: He exhibits no edema.  Lymphadenopathy:    He has no cervical adenopathy.  Neurological: He is oriented to person, place, and time.  Vitals reviewed.   Lab Results  Component Value Date   WBC 8.3 08/17/2016   HGB 15.1 08/17/2016   HCT 44.9 08/17/2016   PLT 316 08/17/2016   GLUCOSE 117 (H) 08/17/2016   CHOL 170 08/17/2016   TRIG 229 (H) 08/17/2016   HDL 35 (L) 08/17/2016   LDLDIRECT 169.0 12/18/2015   LDLCALC 89 08/17/2016   ALT 44 08/17/2016   AST 18 08/17/2016   NA 141 08/17/2016   K 4.4 08/17/2016   CL 101 08/17/2016   CREATININE 1.14 08/17/2016   BUN 7 08/17/2016   CO2 22 08/17/2016   TSH 1.36 05/05/2014   INR 0.9 06/10/2014   HGBA1C 6.5 07/21/2014    Mr Card Morphology Wo/w Cm  Result Date: 10/08/2014 CLINICAL DATA:  51 year old male with frequent PVCs - 20.000 in 24 hours, s/p RF ablation with moderately depressed LVEF on echocardiogram. Evaluate for LVEF post treatment and for possible structural heart disease. EXAM: CARDIAC MRI TECHNIQUE: The patient was scanned on a 1.5 Tesla GE magnet. A dedicated cardiac coil was used. Functional imaging was done using Fiesta sequences. 2,3, and 4  chamber views were done to assess for RWMA's. Modified Simpson's rule using a short axis stack was used to calculate an ejection fraction on a dedicated work [employer]. The patient received 40 cc of Multihance. After 10 minutes inversion recovery sequences were used to assess for infiltration and scar tissue. CONTRAST:  40 cc  of Multihance FINDINGS: 1. Mildly dilated left ventricle with normal thickness and normal systolic function (LVEF = 60%). No regional wall motion abnormalities. LVEDD:  53 mm LVESD:  32 mm LVEDV:  153 ml LVESV:  61 ml SV:  92 ml CO:  6.5 L/minute Myocardial mass:  158 g 2. Normal right ventricular size, thickness and systolic function (RVEF = 50%). No  regional wall motion abnormalities. RVEDV:  183 ml RVESV:  92 ml SV:  91 ml CO:  6.5 L/minute 3.  Mild left atrial dilatation. 4.  Mild mitral regurgitation, trivial tricuspid regurgitation. 5. No evidence of late gadolinium enhancement in the myocardium of the left ventricle. 6.  Normal caliber of the aortic root, aorta and pulmonary artery. IMPRESSION: 1. Mildly dilated left ventricle with normal thickness and normal systolic function (LVEF = 60%). No regional wall motion abnormalities. 2. Normal right ventricular size, thickness and systolic function (RVEF = 50%). No regional wall motion abnormalities. 3.  Mild left atrial dilatation. 4.  Mild mitral regurgitation, trivial tricuspid regurgitation. 5. No evidence of late gadolinium enhancement in the myocardium of the left ventricle. Tobias Alexander Electronically Signed   By: Tobias Alexander   On: 10/08/2014 17:29    Assessment & Plan:   Argel was seen today for follow-up.  Diagnoses and all orders for this visit:  Essential hypertension -     metoprolol tartrate (LOPRESSOR) 25 MG tablet; TAKE 1 TABLET (25 MG TOTAL) BY MOUTH 2 (TWO) TIMES DAILY. -     Omega-3 Fatty Acids (FISH OIL) 1000 MG CAPS; Take 2 capsules (2,000 mg total) by mouth 2 (two) times daily.  Pure hypercholesterolemia -     Lipid panel; Future -     rosuvastatin (CRESTOR) 20 MG tablet; Take 1 tablet (20 mg total) by mouth daily.  Insomnia, unspecified type -     zolpidem (AMBIEN) 10 MG tablet; Take 1 tablet (10 mg total) by mouth at bedtime as needed. Keep appt for future refills.   I am having Mr. Klingensmith maintain his Psyllium (VEGETABLE LAXATIVE PO), vardenafil, clindamycin, HYDROcodone-acetaminophen, HYDROcodone-acetaminophen, metoprolol tartrate, Fish Oil, zolpidem, and rosuvastatin.  Meds ordered this encounter  Medications  . clindamycin (CLEOCIN) 300 MG capsule    Sig: TAKE 1 CAPSULE BY MOUTH THREE TIMES A DAY UNTIL GONE    Refill:  0  . HYDROcodone-acetaminophen  (NORCO) 7.5-325 MG tablet    Sig: TAKE 1 TABLET BY MOUTH EVERY 4 TO 6 HOURS AS NEEDED FOR PAIN    Refill:  0  . HYDROcodone-acetaminophen (NORCO/VICODIN) 5-325 MG tablet    Sig: TAKE 1 TABLET BY MOUTH EVERY 4 TO 6 HOURS AS NEEDED FOR PAIN    Refill:  0  . metoprolol tartrate (LOPRESSOR) 25 MG tablet    Sig: TAKE 1 TABLET (25 MG TOTAL) BY MOUTH 2 (TWO) TIMES DAILY.    Dispense:  180 tablet    Refill:  3    Order Specific Question:   Supervising Provider    Answer:   Pincus Sanes [[id number]]  . Omega-3 Fatty Acids (FISH OIL) 1000 MG CAPS    Sig: Take 2 capsules (2,000 mg total) by mouth  2 (two) times daily.    Dispense:  360 capsule    Refill:  3    Order Specific Question:   Supervising Provider    Answer:   Pincus Sanes [[id number]]  . zolpidem (AMBIEN) 10 MG tablet    Sig: Take 1 tablet (10 mg total) by mouth at bedtime as needed. Keep appt for future refills.    Dispense:  30 tablet    Refill:  5    Not to exceed 2 additional fills before 12/24/2016    Order Specific Question:   Supervising Provider    Answer:   Pincus Sanes [[id number]]  . rosuvastatin (CRESTOR) 20 MG tablet    Sig: Take 1 tablet (20 mg total) by mouth daily.    Dispense:  90 tablet    Refill:  3    Order Specific Question:   Supervising Provider    Answer:   Pincus Sanes [[id number]]    Follow-up: Return in about 6 months (around 08/03/2017) for hyperlipidemia with Dr. Okey Dupre.  Alysia Penna, NP

## 2017-02-01 NOTE — Patient Instructions (Signed)
Go to basement for blood draw 

## 2017-02-02 ENCOUNTER — Other Ambulatory Visit (INDEPENDENT_AMBULATORY_CARE_PROVIDER_SITE_OTHER): Payer: 59

## 2017-02-02 DIAGNOSIS — E7800 Pure hypercholesterolemia, unspecified: Secondary | ICD-10-CM | POA: Diagnosis not present

## 2017-02-02 LAB — LIPID PANEL
CHOL/HDL RATIO: 5
Cholesterol: 152 mg/dL (ref 0–200)
HDL: 33.5 mg/dL — AB (ref >39.00)
LDL CALC: 84 mg/dL (ref 0–99)
NONHDL: 118.15
Triglycerides: 171 mg/dL — ABNORMAL HIGH (ref 0.0–149.0)
VLDL: 34.2 mg/dL (ref 0.0–40.0)

## 2017-02-05 ENCOUNTER — Encounter: Payer: Self-pay | Admitting: Physician Assistant

## 2017-02-05 NOTE — Progress Notes (Signed)
Cardiology Office Note    Date:  02/08/2017  ID:  Larry Rivera, DOB 1966-01-05, MRN [id number] PCP:  Myrlene Broker, MD  Cardiologist:  Dr. Delton See   Chief Complaint: f/u PVCs  History of Present Illness:  Larry Rivera is a 51 y.o. male with history of PVCs/paroxysmal VT s/p ablation 06/2014, hyperlipidemia, hyperglycemia, diastolic dysfunction without CHF, obesity who presents for 6 month checkup.  He developed monomorphic VT during a stress test in 2008. Subsequent workup was unrevealing as he had preserved left ventricular function and no obstructive coronary artery disease. Cardiac MRI showed normal cardiac chambers, EF 58% without wall motion abnormalities, no evidence of RV dysplasia or fatty infiltration of the right ventricular free wall. In 2015, he was found to have frequent PVCs by EKG and wore a Holter monitor demonstrating 47,000 PVCs in a 24-hour period. TSH wnl. He had ectopy despite BB titration therefore underwent PVC ablation. Last echo 01/23/15 showed mild LVH, EF 55-60%, diastolic dysfunction, mild biatrial enlargement, normal LV filling pressure. Last labs showed trig 171 and LDL 84 (01/2017), glucose 117, Cr 1.14, K 4.4, Hgb 15.1 (08/2016). The triglyceride level was 229 in 08/2016 but he was asked to restart fish oil at that time.  He returns for follow-up today feeling great. No interim chest pain, SOB, palpitations or syncope. Had dental work a while back which affected his ability to eat so as a result he's pleased he's lost some weight. He is not currently exercising. BP mildly elevated today - states he has h/o elevated BP in doctors' offices but recent pressure at PCP office was 02/01/17. He also recalls another recent reading 118/80.   Past Medical History:  Diagnosis Date  . Allergic rhinitis due to pollen   . Frequent PVCs    a. s/p PVC ablation 2015.  Marland Kitchen Hypertriglyceridemia   . Insomnia, unspecified   . Obesity   . Other and unspecified hyperlipidemia     . Paroxysmal ventricular tachycardia (HCC)    a. 2008 - monomorphic VT during stress test - cath with clean coronaries.  Marland Kitchen Psychosexual dysfunction with inhibited sexual excitement   . Unspecified part of closed fracture of clavicle     Past Surgical History:  Procedure Laterality Date  . ABLATION  06/17/14   PVC ablation by Dr Ladona Ridgel - originating from the base of the left ventricle at approximately 12:00 just anterior to the mitral valve annulus  . CYSTECTOMY     Back of neck  . RHINOPLASTY     Deviated septum  . V-TACH ABLATION N/A 06/17/2014   Procedure: PVC ABLATION;  Surgeon: Marinus Maw, MD;  Location: [facility] CATH LAB;  Service: Cardiovascular;  Laterality: N/A;    Current Medications: Current Meds  Medication Sig  . metoprolol tartrate (LOPRESSOR) 25 MG tablet TAKE 1 TABLET (25 MG TOTAL) BY MOUTH 2 (TWO) TIMES DAILY.  Marland Kitchen Omega-3 Fatty Acids (FISH OIL) 1000 MG CAPS Take 2 capsules (2,000 mg total) by mouth 2 (two) times daily.  . rosuvastatin (CRESTOR) 20 MG tablet Take 1 tablet (20 mg total) by mouth daily.  Marland Kitchen zolpidem (AMBIEN) 10 MG tablet Take 1 tablet (10 mg total) by mouth at bedtime as needed. Keep appt for future refills.     Allergies:   Penicillins   Social History   Social History  . Marital status: Married    Spouse name: N/A  . Number of children: 1  . Years of education: 17   Occupational History  .  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 Smoking status: Current Every Day Smoker    Packs/day: 0.50    Years: 30.00    Types: Cigars  . Smokeless tobacco: Never Used     Comment: Cigar Only: not every day.  . Alcohol use Yes     Comment: rare drink  . Drug use: No  . Sexual activity: Yes    Partners: Female   Other Topics Concern  . None   Social History Narrative   HSG. Married '91. 1 dtr '95. Work - truck driver - long haul.     Family History:  Family History  Problem Relation Age of Onset  . Lung disease Maternal  Grandmother   . Stroke Maternal Grandmother   . Rheum arthritis MotheBeMarylandckie Saltsbetes Mother        neurBeMaryl<[id number] [email or web address].Earlie [phone] ssium 4.4; Sodium 141   Lipid Panel    Component Value Date/Time   CHOL 152 02/02/2017 0812   CHOL 170 08/17/2016 1015   TRIG 171.0 (H) 02/02/2017 0812   HDL 33.50 (L) 02/02/2017 0812   HDL 35 (L) 08/17/2016 1015   CHOLHDL 5 02/02/2017 0812   VLDL 34.2 02/02/2017 0812   LDLCALC 84 02/02/2017 0812   LDLCALC 89 08/17/2016 1015   LDLDIRECT 169.0 12/18/2015 0922    Additional studies/ records that were reviewed today include: Summarized above, also:  LHC 2008: ANGIOGRAPHIC DATA:  1. Ventriculography done in the RAO  projection.  Overall systolic      function appears at the low normal range.  Estimated ejection      fraction would be in the 50% range.  Does not appear to be      significant regurgitation.  There is not a definite wall motion      abnormality.  2. The left main is free of critical disease.  3. The LAD is a large vessel with perhaps some mild ectasia.  There is      minimal luminal irregularity.  There are  two diagonal branches, all      of which appear free of critical disease.  4. The circumflex provides a large marginal with multiple branches.      It is free of critical disease.  5. Right coronary is large-caliber vessel without significant focal      narrowing in multiple posterolateral branches.  CONCLUSION:  1. Low normal ejection fraction.  2. No significant coronary obstruction.  3. Exercise-induced ventricular tachycardia of uncertain etiology.     ASSESSMENT & PLAN:   1. PVCs/paroxysmal VT - quiescent s/p ablation. EKG unremarkable. Continue metoprolol. 2. Hyperlipidemia - currently followed by primary care. We had long discussion about continued lifestyle modification. 3. Hyperglycemia - discussed importance of regular physical activity and healthy lifestyle choices. 4. Diastolic dysfunction without CHF - continue BP control. 5. Obesity - as above, discussed lifestyle modification. 6. Elevated BP reading - Dr. Lindaann Slough note does not allude to h/o HTN but primary care note note does. Recent BP at PCP's office was normal. Discussed checking BP several times over the next week and calling in with BP readings at that time. If consistently >120/80 would consider titration of antihypertensive.  Disposition: F/u with Dr. Delton See in 6 months.   Medication Adjustments/Labs and Tests Ordered: Current medicines are reviewed at length with the patient today.  Concerns regarding medicines are outlined above. Medication changes, Labs and Tests ordered today are summarized above  and listed in the Patient Instructions accessible in Encounters.   Signed, Laurann Montana, PA-C  02/08/2017 9:56 AM    [facility] [address] [place], [place], [place]  [address] Phone: [phone]; Fax: [phone]

## 2017-02-08 ENCOUNTER — Encounter (INDEPENDENT_AMBULATORY_CARE_PROVIDER_SITE_OTHER): Payer: Self-pay

## 2017-02-08 ENCOUNTER — Encounter: Payer: Self-pay | Admitting: Physician Assistant

## 2017-02-08 ENCOUNTER — Ambulatory Visit (INDEPENDENT_AMBULATORY_CARE_PROVIDER_SITE_OTHER): Payer: 59 | Admitting: Physician Assistant

## 2017-02-08 VITALS — BP 134/80 | HR 72 | Ht 74.0 in | Wt 236.1 lb

## 2017-02-08 DIAGNOSIS — I493 Ventricular premature depolarization: Secondary | ICD-10-CM | POA: Diagnosis not present

## 2017-02-08 DIAGNOSIS — I472 Ventricular tachycardia, unspecified: Secondary | ICD-10-CM

## 2017-02-08 DIAGNOSIS — R739 Hyperglycemia, unspecified: Secondary | ICD-10-CM | POA: Diagnosis not present

## 2017-02-08 DIAGNOSIS — I519 Heart disease, unspecified: Secondary | ICD-10-CM | POA: Diagnosis not present

## 2017-02-08 DIAGNOSIS — E785 Hyperlipidemia, unspecified: Secondary | ICD-10-CM

## 2017-02-08 DIAGNOSIS — E669 Obesity, unspecified: Secondary | ICD-10-CM | POA: Diagnosis not present

## 2017-02-08 DIAGNOSIS — I5189 Other ill-defined heart diseases: Secondary | ICD-10-CM

## 2017-02-08 DIAGNOSIS — I4729 Other ventricular tachycardia: Secondary | ICD-10-CM

## 2017-02-08 DIAGNOSIS — R030 Elevated blood-pressure reading, without diagnosis of hypertension: Secondary | ICD-10-CM | POA: Diagnosis not present

## 2017-02-08 NOTE — Patient Instructions (Addendum)
Medication Instructions:  Your physician recommends that you continue on your current medications as directed. Please refer to the Current Medication list given to you today.  Labwork: None ordered  Testing/Procedures: None ordered  Follow-Up: Your physician wants you to follow-up in: 6 MONTHS WITH DR. Johnell Comings will receive a reminder letter in the mail two months in advance. If you don't receive a letter, please call our office to schedule the follow-up appointment.    Any Other Special Instructions Will Be Listed Below (If Applicable). Your physician has requested that you regularly monitor and record your blood pressure readings at at couple different times a day at home. Please use the same machine at the same time of day to check your readings and record them and call us in 1 week with the readings.   Date: _______________________  a.m. _____________________  p.m. _____________________  Date: _______________________  a.m. _____________________  p.m. _____________________  Date: _______________________  a.m. _____________________  p.m. _____________________  Date: _______________________  a.m. _____________________  p.m. _____________________  Date: _______________________  a.m. _____________________  p.m. _____________________  Date: _______________________  a.m. _____________________  p.m. _____________________  Date: _______________________  a.m. _____________________  p.m. _____________________  Date: _______________________  a.m. _____________________  p.m. _____________________  Date: _______________________  a.m. _____________________  p.m. _____________________  Date: _______________________  a.m. _____________________  p.m. _____________________  Date: _______________________  a.m. _____________________  p.m. _____________________  Date: _______________________  a.m. _____________________  p.m.  _____________________  Date: _______________________  a.m. _____________________  p.m. _____________________  Date: _______________________  a.m. _____________________  p.m. _____________________  Date: _______________________  a.m. _____________________  p.m. _____________________      If you need a refill on your cardiac medications before your next appointment, please call your pharmacy.

## 2017-03-18 ENCOUNTER — Other Ambulatory Visit: Payer: Self-pay | Admitting: Internal Medicine

## 2017-07-21 ENCOUNTER — Encounter: Payer: Self-pay | Admitting: Cardiology

## 2017-08-11 ENCOUNTER — Ambulatory Visit: Payer: 59 | Admitting: Cardiology

## 2017-11-06 ENCOUNTER — Encounter: Payer: Self-pay | Admitting: Cardiology

## 2017-11-06 ENCOUNTER — Encounter: Payer: Self-pay | Admitting: *Deleted

## 2017-11-06 ENCOUNTER — Ambulatory Visit: Payer: 59 | Admitting: Cardiology

## 2017-11-06 VITALS — BP 118/76 | HR 68 | Ht 73.0 in | Wt 230.0 lb

## 2017-11-06 DIAGNOSIS — I10 Essential (primary) hypertension: Secondary | ICD-10-CM

## 2017-11-06 DIAGNOSIS — I493 Ventricular premature depolarization: Secondary | ICD-10-CM | POA: Diagnosis not present

## 2017-11-06 DIAGNOSIS — I472 Ventricular tachycardia: Secondary | ICD-10-CM | POA: Diagnosis not present

## 2017-11-06 DIAGNOSIS — E785 Hyperlipidemia, unspecified: Secondary | ICD-10-CM | POA: Diagnosis not present

## 2017-11-06 DIAGNOSIS — E669 Obesity, unspecified: Secondary | ICD-10-CM

## 2017-11-06 DIAGNOSIS — I5189 Other ill-defined heart diseases: Secondary | ICD-10-CM

## 2017-11-06 DIAGNOSIS — I4729 Other ventricular tachycardia: Secondary | ICD-10-CM

## 2017-11-06 NOTE — Progress Notes (Signed)
Cardiology Office Note    Date:  11/06/2017  ID:  Larry Rivera, DOB 1966-05-01, MRN [id number] PCP:  Myrlene Broker, MD  Cardiologist:  Dr. Delton See   Chief Complaint: f/u PVCs  History of Present Illness:  Larry Rivera is a 52 y.o. male with history of PVCs/paroxysmal VT s/p ablation 06/2014, hyperlipidemia, hyperglycemia, diastolic dysfunction without CHF, obesity who presents for 6 month checkup.  He developed monomorphic VT during a stress test in 2008. Subsequent workup was unrevealing as he had preserved left ventricular function and no obstructive coronary artery disease. Cardiac MRI showed normal cardiac chambers, EF 58% without wall motion abnormalities, no evidence of RV dysplasia or fatty infiltration of the right ventricular free wall. In 2015, he was found to have frequent PVCs by EKG and wore a Holter monitor demonstrating 47,000 PVCs in a 24-hour period. TSH wnl. He had ectopy despite BB titration therefore underwent PVC ablation. Last echo 01/23/15 showed mild LVH, EF 55-60%, diastolic dysfunction, mild biatrial enlargement, normal LV filling pressure. Last labs showed trig 171 and LDL 84 (01/2017), glucose 117, Cr 1.14, K 4.4, Hgb 15.1 (08/2016). The triglyceride level was 229 in 08/2016 but he was asked to restart fish oil at that time.  He returns for follow-up today feeling great. No interim chest pain, SOB, palpitations or syncope. Had dental work a while back which affected his ability to eat so as a result he's pleased he's lost some weight. He is not currently exercising. BP mildly elevated today - states he has h/o elevated BP in doctors' offices but recent pressure at PCP office was 02/01/17. He also recalls another recent reading 118/80.  11/06/2017 - 9 months follow-up, the patient is doing well, he doesn't exercise but is very busy works full-time and works around his house, he has had no syncope, chest pain or short breath, o dizziness. No side effects with Crestor or  metoprolol.   Past Medical History:  Diagnosis Date  . Allergic rhinitis due to pollen   . Frequent PVCs    a. s/p PVC ablation 2015.  Marland Kitchen Hypertriglyceridemia   . Insomnia, unspecified   . Obesity   . Other and unspecified hyperlipidemia   . Paroxysmal ventricular tachycardia (HCC)    a. 2008 - monomorphic VT during stress test - cath with clean coronaries.  Marland Kitchen Psychosexual dysfunction with inhibited sexual excitement   . Unspecified part of closed fracture of clavicle     Past Surgical History:  Procedure Laterality Date  . ABLATION  06/17/14   PVC ablation by Dr Ladona Ridgel - originating from the base of the left ventricle at approximately 12:00 just anterior to the mitral valve annulus  . CYSTECTOMY     Back of neck  . RHINOPLASTY     Deviated septum  . V-TACH ABLATION N/A 06/17/2014   Procedure: PVC ABLATION;  Surgeon: Marinus Maw, MD;  Location: [facility] CATH LAB;  Service: Cardiovascular;  Laterality: N/A;    Current Medications: Current Meds  Medication Sig  . metoprolol tartrate (LOPRESSOR) 25 MG tablet TAKE 1 TABLET (25 MG TOTAL) BY MOUTH 2 (TWO) TIMES DAILY.  Marland Kitchen Omega-3 Fatty Acids (FISH OIL) 1000 MG CAPS Take 2 capsules (2,000 mg total) by mouth 2 (two) times daily.  . rosuvastatin (CRESTOR) 20 MG tablet Take 1 tablet (20 mg total) by mouth daily.     Allergies:   Penicillins   Social History   Socioeconomic History  . Marital status: Married  Spouse name: Not on file  . Number of children: 1  . Years of education: 46  . Highest education level: Not on file  Occupational History  . Occupation: truck Air traffic controller: [employer]  Social Needs  . Financial resource strain: Not on file  . Food insecurity:    Worry: Not on file    Inability: Not on file  . Transportation needs:    Medical: Not on file    Non-medical: Not on file  Tobacco Use  . Smoking status: Current Every Day Smoker    Packs/day: 0.50    Years: 30.00    Pack years: 15.00    Types:  Cigars  . Smokeless tobacco: Never Used  . Tobacco comment: Cigar Only: not every day.  Substance and Sexual Activity  . Alcohol use: Yes    Comment: rare drink  . Drug use: No  . Sexual activity: Yes    Partners: Female  Lifestyle  . Physical activity:    Days per week: Not on file    Minutes per session: Not on file  . Stress: Not on file  Relationships  . Social connections:    Talks on phone: Not on file    Gets together: Not on file    Attends religious service: Not on file    Active member of club or organization: Not on file    Attends meetings of clubs or organizations: Not on file    Relationship status: Not on file  Other Topics Concern  . Not on file  Social History Narrative   HSG. Married '91. 1 dtr '95. Work - Naval architect - long haul.     Family History:  Family History  Problem Relation Age of Onset  . Rheum arthritis Mother   . Diabetes Mother        neuropathy  . Hyperlipidemia Father   . Diabetes Sister   . Lung disease Maternal Grandmother   . Stroke Maternal Grandmother   . Cancer Neg Hx   . Heart disease Neg Hx     ROS:   Please see the history of present illness.  All other systems are reviewed and otherwise negative.    PHYSICAL EXAM:   VS:  BP 118/76   Pulse 68   Ht 6\' 1"  (1.854 m)   Wt 230 lb (104.3 kg)   BMI 30.34 kg/m   BMI: Body mass index is 30.34 kg/m. GEN: Well nourished, well developed WM, in no acute distress  HEENT: normocephalic, atraumatic Neck: no JVD, carotid bruits, or masses Cardiac: RRR; no murmurs, rubs, or gallops, no edema  Respiratory:  clear to auscultation bilaterally, normal work of breathing GI: soft, nontender, nondistended, + BS MS: no deformity or atrophy  Skin: warm and dry, no rash Neuro:  Alert and Oriented x 3, Strength and sensation are intact, follows commands Psych: euthymic mood, full affect  Wt Readings from Last 3 Encounters:  11/06/17 230 lb (104.3 kg)  02/08/17 236 lb 1.9 oz (107.1 kg)   02/01/17 239 lb (108.4 kg)      Studies/Labs Reviewed:   EKG:  EKG was ordered today and personally reviewed by me and demonstrates NSR 69bpm, TWI III, nonspeciifc TW change avF. No acute change from prior.QTc .  Recent Labs: No results found for requested labs within last 8760 hours.   Lipid Panel    Component Value Date/Time   CHOL 152 02/02/2017 0812   CHOL 170 08/17/2016 1015  TRIG 171.0 (H) 02/02/2017 0812   HDL 33.50 (L) 02/02/2017 0812   HDL 35 (L) 08/17/2016 1015   CHOLHDL 5 02/02/2017 0812   VLDL 34.2 02/02/2017 0812   LDLCALC 84 02/02/2017 0812   LDLCALC 89 08/17/2016 1015   LDLDIRECT 169.0 12/18/2015 0922    Additional studies/ records that were reviewed today include: Summarized above, also:  LHC 2008: ANGIOGRAPHIC DATA:  1. Ventriculography done in the RAO projection.  Overall systolic      function appears at the low normal range.  Estimated ejection      fraction would be in the 50% range.  Does not appear to be      significant regurgitation.  There is not a definite wall motion      abnormality.  2. The left main is free of critical disease.  3. The LAD is a large vessel with perhaps some mild ectasia.  There is      minimal luminal irregularity.  There are two diagonal branches, all      of which appear free of critical disease.  4. The circumflex provides a large marginal with multiple branches.      It is free of critical disease.  5. Right coronary is large-caliber vessel without significant focal      narrowing in multiple posterolateral branches.  CONCLUSION:  1. Low normal ejection fraction.  2. No significant coronary obstruction.  3. Exercise-induced ventricular tachycardia of uncertain etiology.   ASSESSMENT & PLAN:   1. PVCs/paroxysmal VT on a treadmill test in 2008-  s/p ablation. EKG unremarkable. Continue metoprolol.no recurrent episodes since then. 2. Hyperlipidemia - currently followed by primary care. Tolerating Crestor  well. 3. Hyperglycemia - discussed importance of regular physical activity and healthy lifestyle choices. 4. Diastolic dysfunction without CHF - continue BP control. 5. Obesity -he has lost 30 pounds, he is congratulated on that and is advised to lose another 30 pounds, with a goal of 200 pounds. 6. Hypertension - now well controlled.  Disposition: F/u with Dr. Delton See in 1 year.  Medication Adjustments/Labs and Tests Ordered: Current medicines are reviewed at length with the patient today.  Concerns regarding medicines are outlined above. Medication changes, Labs and Tests ordered today are summarized above and listed in the Patient Instructions accessible in Encounters.   Signed, Tobias Alexander, MD  11/06/2017 8:51 AM    [facility] [address] [place], [place], [place]  [address] Phone: [phone]; Fax: [phone]

## 2017-11-06 NOTE — Patient Instructions (Addendum)

## 2018-04-27 ENCOUNTER — Telehealth: Payer: Self-pay | Admitting: Cardiology

## 2018-04-27 ENCOUNTER — Other Ambulatory Visit: Payer: Self-pay | Admitting: *Deleted

## 2018-04-27 DIAGNOSIS — I10 Essential (primary) hypertension: Secondary | ICD-10-CM

## 2018-04-27 MED ORDER — METOPROLOL TARTRATE 25 MG PO TABS: ORAL_TABLET | ORAL | 1 refills | Status: DC

## 2018-04-27 NOTE — Telephone Encounter (Signed)
New Message   Pt c/o medication issue:  1. Name of Medication: rosuvastatin (CRESTOR) 20 MG tablet  2. How are you currently taking this medication (dosage and times per day)? Take 1 tablet (20 mg total) by mouth daily.  3. Are you having a reaction (difficulty breathing--STAT)?  4. What is your medication issue? Patient is calling because the cost of the rosuvastatin is costly he would like to know is there an alternative that is cheaper. Please call.

## 2018-04-27 NOTE — Telephone Encounter (Signed)
Pt is calling to let Dr Delton See know that his rosuvastatin is now costing him $180 to refill each time.   Pt states it was $90 and now has gone up to $180.  Pt has full coverage health insurance with [facility]. Pt would like for Dr Delton See to possibly advise on a different statin, that is more cost effective.  [facility], but will have his meds transferred to a cheaper pharmacy, if that helps.  Rosuvastatin is not on [facility] $4 list, however simvastatin and atorvastatin are.  Pt is scheduled to see Dr Delton See for next Thursday 9/19 at 0820.  Informed the pt that Dr Delton See is out of the office today, but I will route this message to her for further review and recommendation, and follow-up with the pt shortly thereafter.  Pt verbalized understanding and agrees with this plan.

## 2018-04-27 NOTE — Telephone Encounter (Signed)
New Message ° ° ° °*STAT* If patient is at the pharmacy, call can be transferred to refill team. ° ° °1. Which medications need to be refilled? (please list name of each medication and dose if known) metoprolol tartrate (LOPRESSOR) 25 MG tablet °2. Which pharmacy/location (including street and city if local pharmacy) is medication to be sent to? [id number] - [place], [place] - [address] ° °3. Do they need a 30 day or 90 day supply? 90 day  ° ° °

## 2018-04-28 NOTE — Telephone Encounter (Signed)
Larry Rivera,  Would you be able to see which statin is cheaper? Thank you!!! K

## 2018-05-01 MED ORDER — ATORVASTATIN CALCIUM 40 MG PO TABS: 40.0000 mg | ORAL_TABLET | Freq: Every day | ORAL | 2 refills | Status: DC

## 2018-05-01 NOTE — Telephone Encounter (Signed)
Thank you Aundra Millet and Lajoyce Corners!

## 2018-05-01 NOTE — Telephone Encounter (Signed)
Called pharmacy and verified they have correct insurance on file for patient. Not sure how generic rosuvastatin is so expensive. Pharmacy quoted $43 for a 3 month supply of Lipitor. Would recommend switching to equivalent dose of Lipitor 40mg  daily.

## 2018-05-01 NOTE — Telephone Encounter (Signed)
Spoke with the pt and informed him that per Dr Delton See and our Pharmacist [facility], we will stop his rosuvastatin, due to insurance coverage, and switch him to a coverage statin called atorvastatin 40 mg po daily.  Informed the pt that with this switch, it will only cost him $43 for a 90 day supply of this med.  Confirmed the pharmacy of choice with the pt.  Pt verbalized understanding and agrees with this plan.

## 2018-05-03 ENCOUNTER — Encounter (INDEPENDENT_AMBULATORY_CARE_PROVIDER_SITE_OTHER): Payer: Self-pay

## 2018-05-03 ENCOUNTER — Encounter: Payer: Self-pay | Admitting: Cardiology

## 2018-05-03 ENCOUNTER — Ambulatory Visit: Payer: 59 | Admitting: Cardiology

## 2018-05-03 ENCOUNTER — Encounter: Payer: Self-pay | Admitting: *Deleted

## 2018-05-03 VITALS — BP 100/76 | HR 53 | Ht 73.0 in | Wt 235.0 lb

## 2018-05-03 DIAGNOSIS — I10 Essential (primary) hypertension: Secondary | ICD-10-CM

## 2018-05-03 DIAGNOSIS — I493 Ventricular premature depolarization: Secondary | ICD-10-CM

## 2018-05-03 DIAGNOSIS — E782 Mixed hyperlipidemia: Secondary | ICD-10-CM

## 2018-05-03 MED ORDER — ATORVASTATIN CALCIUM 40 MG PO TABS: 40.0000 mg | ORAL_TABLET | Freq: Every day | ORAL | 2 refills | Status: DC

## 2018-05-03 MED ORDER — METOPROLOL TARTRATE 25 MG PO TABS: ORAL_TABLET | ORAL | 2 refills | Status: DC

## 2018-05-03 NOTE — Progress Notes (Signed)
Cardiology Office Note    Date:  05/03/2018  ID:  Larry Rivera, DOB 1966-07-02, MRN [id number] PCP:  Myrlene Broker, MD  Cardiologist:  Dr. Delton See   Chief Complaint: 6 months follow up  History of Present Illness:  Larry Rivera is a 52 y.o. male with history of PVCs/paroxysmal VT s/p ablation 06/2014, hyperlipidemia, hyperglycemia, diastolic dysfunction without CHF, obesity who presents for 6 month checkup. He developed monomorphic VT during a stress test in 2008. Subsequent workup was unrevealing as he had preserved left ventricular function and no obstructive coronary artery disease. Cardiac MRI showed normal cardiac chambers, EF 58% without wall motion abnormalities, no evidence of RV dysplasia or fatty infiltration of the right ventricular free wall. In 2015, he was found to have frequent PVCs by EKG and wore a Holter monitor demonstrating 47,000 PVCs in a 24-hour period. TSH wnl. He had ectopy despite BB titration therefore underwent PVC ablation. Last echo 01/23/15 showed mild LVH, EF 55-60%, diastolic dysfunction, mild biatrial enlargement, normal LV filling pressure. Last labs showed trig 171 and LDL 84 (01/2017), glucose 117, Cr 1.14, K 4.4, Hgb 15.1 (08/2016). The triglyceride level was 229 in 08/2016 but he was asked to restart fish oil at that time.  05/03/18 - 6 months follow up, he states again that he ever since the vT ablation he feels great. He stays busy working in his yard and denies any palpitations, chest pain, DOE, no LE edema, no side effects from meds.  Past Medical History:  Diagnosis Date  . Allergic rhinitis due to pollen   . Frequent PVCs    a. s/p PVC ablation 2015.  Marland Kitchen Hypertriglyceridemia   . Insomnia, unspecified   . Obesity   . Other and unspecified hyperlipidemia   . Paroxysmal ventricular tachycardia (HCC)    a. 2008 - monomorphic VT during stress test - cath with clean coronaries.  Marland Kitchen Psychosexual dysfunction with inhibited sexual excitement   .  Unspecified part of closed fracture of clavicle     Past Surgical History:  Procedure Laterality Date  . ABLATION  06/17/14   PVC ablation by Dr Ladona Ridgel - originating from the base of the left ventricle at approximately 12:00 just anterior to the mitral valve annulus  . CYSTECTOMY     Back of neck  . RHINOPLASTY     Deviated septum  . V-TACH ABLATION N/A 06/17/2014   Procedure: PVC ABLATION;  Surgeon: Marinus Maw, MD;  Location: [facility] CATH LAB;  Service: Cardiovascular;  Laterality: N/A;    Current Medications: Current Meds  Medication Sig  . atorvastatin (LIPITOR) 40 MG tablet Take 1 tablet (40 mg total) by mouth Larry.  . metoprolol tartrate (LOPRESSOR) 25 MG tablet TAKE 1 TABLET (25 MG TOTAL) BY MOUTH 2 (TWO) TIMES Larry.  Marland Kitchen Omega-3 Fatty Acids (FISH OIL) 1000 MG CAPS Take 2 capsules (2,000 mg total) by mouth 2 (two) times Larry.     Allergies:   Penicillins   Social History   Socioeconomic History  . Marital status: Married    Spouse name: Not on file  . Number of children: 1  . Years of education: 55  . Highest education level: Not on file  Occupational History  . Occupation: truck Air traffic controller: [employer]  Social Needs  . Financial resource strain: Not on file  . Food insecurity:    Worry: Not on file    Inability: Not on file  . Transportation needs:  Medical: Not on file    Non-medical: Not on file  Tobacco Use  . Smoking status: Current Every Day Smoker    Packs/day: 0.50    Years: 30.00    Pack years: 15.00    Types: Cigars  . Smokeless tobacco: Never Used  . Tobacco comment: Cigar Only: not every day.  Substance and Sexual Activity  . Alcohol use: Yes    Comment: rare drink  . Drug use: No  . Sexual activity: Yes    Partners: Female  Lifestyle  . Physical activity:    Days per week: Not on file    Minutes per session: Not on file  . Stress: Not on file  Relationships  . Social connections:    Talks on phone: Not on file    Gets  together: Not on file    Attends religious service: Not on file    Active member of club or organization: Not on file    Attends meetings of clubs or organizations: Not on file    Relationship status: Not on file  Other Topics Concern  . Not on file  Social History Narrative   HSG. Married '91. 1 dtr '95. Work - Naval architect - long haul.     Family History:  Family History  Problem Relation Age of Onset  . Rheum arthritis Mother   . Diabetes Mother        neuropathy  . Hyperlipidemia Father   . Diabetes Sister   . Lung disease Maternal Grandmother   . Stroke Maternal Grandmother   . Cancer Neg Hx   . Heart disease Neg Hx     ROS:   Please see the history of present illness.  All other systems are reviewed and otherwise negative.    PHYSICAL EXAM:   VS:  BP 100/76   Pulse (!) 53   Ht 6\' 1"  (1.854 m)   Wt 235 lb (106.6 kg)   SpO2 97%   BMI 31.00 kg/m   BMI: Body mass index is 31 kg/m. GEN: Well nourished, well developed WM, in no acute distress  HEENT: normocephalic, atraumatic Neck: no JVD, carotid bruits, or masses Cardiac: RRR; no murmurs, rubs, or gallops, no edema  Respiratory:  clear to auscultation bilaterally, normal work of breathing GI: soft, nontender, nondistended, + BS MS: no deformity or atrophy  Skin: warm and dry, no rash Neuro:  Alert and Oriented x 3, Strength and sensation are intact, follows commands Psych: euthymic mood, full affect  Wt Readings from Last 3 Encounters:  05/03/18 235 lb (106.6 kg)  11/06/17 230 lb (104.3 kg)  02/08/17 236 lb 1.9 oz (107.1 kg)      Studies/Labs Reviewed:   EKG:  EKG was ordered today and personally reviewed by me and demonstrates NSR 69bpm, TWI III, nonspeciifc TW change avF. No acute change from prior.QTc .  Recent Labs: No results found for requested labs within last 8760 hours.   Lipid Panel    Component Value Date/Time   CHOL 152 02/02/2017 0812   CHOL 170 08/17/2016 1015   TRIG 171.0 (H)  02/02/2017 0812   HDL 33.50 (L) 02/02/2017 0812   HDL 35 (L) 08/17/2016 1015   CHOLHDL 5 02/02/2017 0812   VLDL 34.2 02/02/2017 0812   LDLCALC 84 02/02/2017 0812   LDLCALC 89 08/17/2016 1015   LDLDIRECT 169.0 12/18/2015 0922    Additional studies/ records that were reviewed today include: Summarized above, also:  LHC 2008: ANGIOGRAPHIC DATA:  1.  Ventriculography done in the RAO projection.  Overall systolic      function appears at the low normal range.  Estimated ejection      fraction would be in the 50% range.  Does not appear to be      significant regurgitation.  There is not a definite wall motion      abnormality.  2. The left main is free of critical disease.  3. The LAD is a large vessel with perhaps some mild ectasia.  There is      minimal luminal irregularity.  There are two diagonal branches, all      of which appear free of critical disease.  4. The circumflex provides a large marginal with multiple branches.      It is free of critical disease.  5. Right coronary is large-caliber vessel without significant focal      narrowing in multiple posterolateral branches.  CONCLUSION:  1. Low normal ejection fraction.  2. No significant coronary obstruction.  3. Exercise-induced ventricular tachycardia of uncertain etiology.   ASSESSMENT & PLAN:   1. PVCs/paroxysmal VT on a treadmill test in 2008-  s/p ablation. Asymptomatic since then. 2. Hyperlipidemia - Tolerating Crestor well. 3. Hypertension -  well controlled. Continue metoprolol.           Disposition: F/u with Dr. Delton See in 6 months.  Medication Adjustments/Labs and Tests Ordered: Current medicines are reviewed at length with the patient today.  Concerns regarding medicines are outlined above. Medication changes, Labs and Tests ordered today are summarized above and listed in the Patient Instructions accessible in Encounters.   Signed, Tobias Alexander, MD  05/03/2018 8:51 AM    [facility]  [facility]Care [address] [place], [place], [place]  [address] Phone: [phone]; Fax: [phone]

## 2018-05-03 NOTE — Patient Instructions (Signed)
Medication Instructions:   Your physician recommends that you continue on your current medications as directed. Please refer to the Current Medication list given to you today.    Labwork:  TODAY--CMET, CBC W DIFF, TSH, AND LIPIDS      Follow-Up:  Your physician wants you to follow-up in: 6 MONTHS WITH DR NELSON You will receive a reminder letter in the mail two months in advance. If you don't receive a letter, please call our office to schedule the follow-up appointment.        If you need a refill on your cardiac medications before your next appointment, please call your pharmacy.   

## 2018-05-04 LAB — COMPREHENSIVE METABOLIC PANEL
ALT: 24 IU/L (ref 0–44)
AST: 15 IU/L (ref 0–40)
Albumin/Globulin Ratio: 1.9 (ref 1.2–2.2)
Albumin: 4.3 g/dL (ref 3.5–5.5)
Alkaline Phosphatase: 74 IU/L (ref 39–117)
BUN/Creatinine Ratio: 7 — ABNORMAL LOW (ref 9–20)
BUN: 7 mg/dL (ref 6–24)
Bilirubin Total: 0.7 mg/dL (ref 0.0–1.2)
CO2: 23 mmol/L (ref 20–29)
Calcium: 9.2 mg/dL (ref 8.7–10.2)
Chloride: 102 mmol/L (ref 96–106)
Creatinine, Ser: 0.97 mg/dL (ref 0.76–1.27)
GFR calc Af Amer: 103 mL/min/{1.73_m2} (ref >59)
GFR calc non Af Amer: 89 mL/min/{1.73_m2} (ref >59)
Globulin, Total: 2.3 g/dL (ref 1.5–4.5)
Glucose: 86 mg/dL (ref 65–99)
Potassium: 4.8 mmol/L (ref 3.5–5.2)
Sodium: 142 mmol/L (ref 134–144)
Total Protein: 6.6 g/dL (ref 6.0–8.5)

## 2018-05-04 LAB — CBC WITH DIFFERENTIAL/PLATELET
Basophils Absolute: 0 10*3/uL (ref 0.0–0.2)
Basos: 0 %
EOS (ABSOLUTE): 0.1 10*3/uL (ref 0.0–0.4)
Eos: 1 %
Hematocrit: 45.2 % (ref 37.5–51.0)
Hemoglobin: 16.1 g/dL (ref 13.0–17.7)
Immature Grans (Abs): 0 10*3/uL (ref 0.0–0.1)
Immature Granulocytes: 0 %
Lymphocytes Absolute: 2.7 10*3/uL (ref 0.7–3.1)
Lymphs: 29 %
MCH: 34.5 pg — ABNORMAL HIGH (ref 26.6–33.0)
MCHC: 35.6 g/dL (ref 31.5–35.7)
MCV: 97 fL (ref 79–97)
Monocytes Absolute: 0.7 10*3/uL (ref 0.1–0.9)
Monocytes: 7 %
Neutrophils Absolute: 6 10*3/uL (ref 1.4–7.0)
Neutrophils: 63 %
Platelets: 348 10*3/uL (ref 150–450)
RBC: 4.67 x10E6/uL (ref 4.14–5.80)
RDW: 14.9 % (ref 12.3–15.4)
WBC: 9.5 10*3/uL (ref 3.4–10.8)

## 2018-05-04 LAB — LIPID PANEL
Chol/HDL Ratio: 7.6 ratio — ABNORMAL HIGH (ref 0.0–5.0)
Cholesterol, Total: 258 mg/dL — ABNORMAL HIGH (ref 100–199)
HDL: 34 mg/dL — ABNORMAL LOW (ref >39)
LDL Calculated: 189 mg/dL — ABNORMAL HIGH (ref 0–99)
Triglycerides: 177 mg/dL — ABNORMAL HIGH (ref 0–149)
VLDL Cholesterol Cal: 35 mg/dL (ref 5–40)

## 2018-05-04 LAB — TSH: TSH: 1.77 u[IU]/mL (ref 0.450–4.500)

## 2019-04-10 ENCOUNTER — Other Ambulatory Visit: Payer: Self-pay | Admitting: Cardiology

## 2019-04-10 DIAGNOSIS — E782 Mixed hyperlipidemia: Secondary | ICD-10-CM

## 2019-04-10 DIAGNOSIS — I493 Ventricular premature depolarization: Secondary | ICD-10-CM

## 2019-04-10 DIAGNOSIS — I10 Essential (primary) hypertension: Secondary | ICD-10-CM

## 2019-05-06 ENCOUNTER — Other Ambulatory Visit: Payer: Self-pay | Admitting: Cardiology

## 2019-05-06 DIAGNOSIS — E782 Mixed hyperlipidemia: Secondary | ICD-10-CM

## 2019-05-06 DIAGNOSIS — I493 Ventricular premature depolarization: Secondary | ICD-10-CM

## 2019-05-06 DIAGNOSIS — I10 Essential (primary) hypertension: Secondary | ICD-10-CM

## 2019-05-08 ENCOUNTER — Telehealth: Payer: Self-pay | Admitting: *Deleted

## 2019-05-08 NOTE — Telephone Encounter (Signed)
Pt is calling because he is scheduled to see Richardson Dopp in later October, but would prefer to see Dr. Meda Coffee, being she hasn't seen him in quite sometime, with covid going on.  Cancelled the pts appt with Nicki Reaper and rescheduled him to see Dr. Meda Coffee on 10/6 at 1140.  Pt aware to arrive 15 mins prior to that appt.  Pt is aware to wear his facial mask to his appt and during the entire duration of his appt. Pt verbalized understanding and agrees with this plan.

## 2019-05-21 ENCOUNTER — Other Ambulatory Visit: Payer: Self-pay

## 2019-05-21 ENCOUNTER — Encounter: Payer: Self-pay | Admitting: Cardiology

## 2019-05-21 ENCOUNTER — Ambulatory Visit: Payer: 59 | Admitting: Cardiology

## 2019-05-21 ENCOUNTER — Encounter: Payer: Self-pay | Admitting: *Deleted

## 2019-05-21 VITALS — BP 122/82 | HR 56 | Ht 74.0 in | Wt 227.8 lb

## 2019-05-21 DIAGNOSIS — I4729 Other ventricular tachycardia: Secondary | ICD-10-CM

## 2019-05-21 DIAGNOSIS — I10 Essential (primary) hypertension: Secondary | ICD-10-CM | POA: Diagnosis not present

## 2019-05-21 DIAGNOSIS — I493 Ventricular premature depolarization: Secondary | ICD-10-CM | POA: Diagnosis not present

## 2019-05-21 DIAGNOSIS — E782 Mixed hyperlipidemia: Secondary | ICD-10-CM

## 2019-05-21 DIAGNOSIS — I472 Ventricular tachycardia: Secondary | ICD-10-CM

## 2019-05-21 MED ORDER — ATORVASTATIN CALCIUM 40 MG PO TABS: 40.0000 mg | ORAL_TABLET | Freq: Every day | ORAL | 3 refills | Status: DC

## 2019-05-21 MED ORDER — METOPROLOL TARTRATE 25 MG PO TABS: ORAL_TABLET | ORAL | 3 refills | Status: DC

## 2019-05-21 NOTE — Patient Instructions (Signed)
Medication Instructions:   Your physician recommends that you continue on your current medications as directed. Please refer to the Current Medication list given to you today.  If you need a refill on your cardiac medications before your next appointment, please call your pharmacy.    Lab work:  NEXT Monday May 27, 2019 AT OUR OFFICE--COME IN THE MORNING TIME-WE OPEN LAB AT 8 AM-- WE WILL CHECK A CMET, CBC W DIFF, TSH, AND LIPIDS--PLEASE COME FASTING TO THIS LAB APPOINTMENT.  If you have labs (blood work) drawn today and your tests are completely normal, you will receive your results only by: Marland Kitchen MyChart Message (if you have MyChart) OR . A paper copy in the mail If you have any lab test that is abnormal or we need to change your treatment, we will call you to review the results.    Follow-Up: At [facility], you and your health needs are our priority.  As part of our continuing mission to provide you with exceptional heart care, we have created designated Provider Care Teams.  These Care Teams include your primary Cardiologist (physician) and Advanced Practice Providers (APPs -  Physician Assistants and Nurse Practitioners) who all work together to provide you with the care you need, when you need it. You will need a follow up appointment in 12 months.  Please call our office 2 months in advance to schedule this appointment.  You may see Ena Dawley, MD or one of the following Advanced Practice Providers on your designated Care Team:   Melina Copa, [place] . Ermalinda Barrios, PA-C

## 2019-05-21 NOTE — Progress Notes (Signed)
Cardiology Office Note    Date:  05/21/2019  ID:  Larry Rivera, DOB [birth year], MRN [id number] PCP:  Myrlene Broker, MD  Cardiologist:  Dr. Delton See   Chief Complaint: 1 year follow up  History of Present Illness:  Larry Rivera is a 53 y.o. male with history of PVCs/paroxysmal VT s/p ablation 06/2014, hyperlipidemia, hyperglycemia, diastolic dysfunction without CHF, obesity who presents for 6 month checkup. He developed monomorphic VT during a stress test in 2008. Subsequent workup was unrevealing as he had preserved left ventricular function and no obstructive coronary artery disease. Cardiac MRI showed normal cardiac chambers, EF 58% without wall motion abnormalities, no evidence of RV dysplasia or fatty infiltration of the right ventricular free wall. In 2015, he was found to have frequent PVCs by EKG and wore a Holter monitor demonstrating 47,000 PVCs in a 24-hour period. TSH wnl. He had ectopy despite BB titration therefore underwent PVC ablation. Last echo 01/23/15 showed mild LVH, EF 55-60%, diastolic dysfunction, mild biatrial enlargement, normal LV filling pressure. Last labs showed trig 171 and LDL 84 (01/2017), glucose 117, Cr 1.14, K 4.4, Hgb 15.1 (08/2016). The triglyceride level was 229 in 08/2016 but he was asked to restart fish oil at that time.  05/21/2019  -the patient is coming after 1 year, he denies any problems whatsoever, he has no chest pain, shortness of breath no palpitation no dizziness presyncope or syncope.  He has been compliant with her medications and has no side effects.  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    Past Medical History:  Diagnosis Date  . Allergic rhinitis due to pollen   . Frequent PVCs    a. s/p PVC ablation 2015.  Marland Kitchen Hypertriglyceridemia   . Insomnia, unspecified   . Obesity   . Other and unspecified hyperlipidemia   . Paroxysmal ventricular tachycardia (HCC)    a. 2008 - monomorphic VT during  stress test - cath with clean coronaries.  Marland Kitchen Psychosexual dysfunction with inhibited sexual excitement   . Unspecified part of closed fracture of clavicle    Past Surgical History:  Procedure Laterality Date  . ABLATION  06/17/14   PVC ablation by Dr Ladona Ridgel - originating from the base of the left ventricle at approximately 12:00 just anterior to the mitral valve annulus  . CYSTECTOMY     Back of neck  . RHINOPLASTY     Deviated septum  . V-TACH ABLATION N/A 06/17/2014   Procedure: PVC ABLATION;  Surgeon: Marinus Maw, MD;  Location: [facility] CATH LAB;  Service: Cardiovascular;  Laterality: N/A;   Current Medications: Current Meds  Medication Sig  . atorvastatin (LIPITOR) 40 MG tablet Take 1 tablet (40 mg total) by mouth daily. Please make yearly appt with Dr. Delton See for September before anymore refills. 1st attempt  . metoprolol tartrate (LOPRESSOR) 25 MG tablet TAKE 1 TABLET BY MOUTH TWICE A DAY. Please make yearly appt with Dr. Delton See for September before anymore refills. 1st attempt  . Omega-3 Fatty Acids (FISH OIL) 1000 MG CAPS Take 2 capsules (2,000 mg total) by mouth 2 (two) times daily.     Allergies:   Penicillins   Social History   Socioeconomic History  . Marital status: Married    Spouse name: Not on file  . Number of children: 1  . Years of education: 90  . Highest education level: Not on file  Occupational History  . Occupation: truck Hospital doctor  Employer: [employer]  Social Needs  . Financial resource strain: Not on file  . Food insecurity    Worry: Not on file    Inability: Not on file  . Transportation needs    Medical: Not on file    Non-medical: Not on file  Tobacco Use  . Smoking status: Current Every Day Smoker    Packs/day: 0.50    Years: 30.00    Pack years: 15.00    Types: Cigars  . Smokeless tobacco: Never Used  . Tobacco comment: Cigar Only: not every day.  Substance and Sexual Activity  . Alcohol use: Yes    Comment: rare drink  . Drug use:  No  . Sexual activity: Yes    Partners: Female  Lifestyle  . Physical activity    Days per week: Not on file    Minutes per session: Not on file  . Stress: Not on file  Relationships  . Social Musician on phone: Not on file    Gets together: Not on file    Attends religious service: Not on file    Active member of club or organization: Not on file    Attends meetings of clubs or organizations: Not on file    Relationship status: Not on file  Other Topics Concern  . Not on file  Social History Narrative   HSG. Married '91. 1 dtr '95. Work - Naval architect - long haul.     Family History:  Family History  Problem Relation Age of Onset  . Rheum arthritis Mother   . Diabetes Mother        neuropathy  . Hyperlipidemia Father   . Diabetes Sister   . Lung disease Maternal Grandmother   . Stroke Maternal Grandmother   . Cancer Neg Hx   . Heart disease Neg Hx    ROS:   Please see the history of present illness.  All other systems are reviewed and otherwise negative.   PHYSICAL EXAM:   VS:  BP 122/82   Pulse (!) 56   Ht 6\' 2"  (1.88 m)   Wt 227 lb 12.8 oz (103.3 kg)   SpO2 99%   BMI 29.25 kg/m   BMI: Body mass index is 29.25 kg/m. GEN: Well nourished, well developed WM, in no acute distress  HEENT: normocephalic, atraumatic Neck: no JVD, carotid bruits, or masses Cardiac: RRR; no murmurs, rubs, or gallops, no edema  Respiratory:  clear to auscultation bilaterally, normal work of breathing GI: soft, nontender, nondistended, + BS MS: no deformity or atrophy  Skin: warm and dry, no rash Neuro:  Alert and Oriented x 3, Strength and sensation are intact, follows commands Psych: euthymic mood, full affect  Wt Readings from Last 3 Encounters:  05/21/19 227 lb 12.8 oz (103.3 kg)  05/03/18 235 lb (106.6 kg)  11/06/17 230 lb (104.3 kg)    Studies/Labs Reviewed:   EKG:  EKG was ordered today and personally reviewed by me and demonstrates normal sinus rhythm,  normal EKG, unchanged from prior.  Recent Labs: No results found for requested labs within last 8760 hours.   Lipid Panel    Component Value Date/Time   CHOL 258 (H) 05/03/2018 0912   TRIG 177 (H) 05/03/2018 0912   HDL 34 (L) 05/03/2018 0912   CHOLHDL 7.6 (H) 05/03/2018 0912   CHOLHDL 5 02/02/2017 0812   VLDL 34.2 02/02/2017 0812   LDLCALC 189 (H) 05/03/2018 0912   LDLDIRECT 169.0 12/18/2015 02/17/2016  Additional studies/ records that were reviewed today include: Summarized above, also:  [place] 2008: ANGIOGRAPHIC DATA:  1. Ventriculography done in the RAO projection.  Overall systolic      function appears at the low normal range.  Estimated ejection      fraction would be in the 50% range.  Does not appear to be      significant regurgitation.  There is not a definite wall motion      abnormality.  2. The left main is free of critical disease.  3. The LAD is a large vessel with perhaps some mild ectasia.  There is      minimal luminal irregularity.  There are two diagonal branches, all      of which appear free of critical disease.  4. The circumflex provides a large marginal with multiple branches.      It is free of critical disease.  5. Right coronary is large-caliber vessel without significant focal      narrowing in multiple posterolateral branches.  CONCLUSION:  1. Low normal ejection fraction.  2. No significant coronary obstruction.  3. Exercise-induced ventricular tachycardia of uncertain etiology.   ASSESSMENT & PLAN:   1. PVCs/paroxysmal VT on a treadmill test in 2008-  s/p ablation. Asymptomatic since then.  No low-dose metoprolol. 2. Hyperlipidemia -he has developed myalgias with rosuvastatin but is tolerating atorvastatin well.  We will obtain his labs next week as he is not n.p.o. today. 3. Hypertension -  well controlled. Continue metoprolol.           Disposition: F/u with Dr. Meda Coffee in 1 year.  Medication Adjustments/Labs and Tests Ordered: Current  medicines are reviewed at length with the patient today.  Concerns regarding medicines are outlined above. Medication changes, Labs and Tests ordered today are summarized above and listed in the Patient Instructions accessible in Encounters.   Signed, Ena Dawley, MD  05/21/2019 11:59 AM    [facility] [place], [place], [place]  [address] Phone: [phone]; Fax: [phone]

## 2019-05-27 ENCOUNTER — Other Ambulatory Visit: Payer: 59 | Admitting: *Deleted

## 2019-05-27 ENCOUNTER — Other Ambulatory Visit: Payer: Self-pay

## 2019-05-27 DIAGNOSIS — E782 Mixed hyperlipidemia: Secondary | ICD-10-CM

## 2019-05-27 DIAGNOSIS — I10 Essential (primary) hypertension: Secondary | ICD-10-CM

## 2019-05-27 DIAGNOSIS — I493 Ventricular premature depolarization: Secondary | ICD-10-CM

## 2019-05-27 LAB — CBC WITH DIFFERENTIAL/PLATELET
Basophils Absolute: 0.1 10*3/uL (ref 0.0–0.2)
Basos: 1 %
EOS (ABSOLUTE): 0.1 10*3/uL (ref 0.0–0.4)
Eos: 1 %
Hematocrit: 45.8 % (ref 37.5–51.0)
Hemoglobin: 15.7 g/dL (ref 13.0–17.7)
Immature Grans (Abs): 0 10*3/uL (ref 0.0–0.1)
Immature Granulocytes: 0 %
Lymphocytes Absolute: 2.7 10*3/uL (ref 0.7–3.1)
Lymphs: 25 %
MCH: 33.1 pg — ABNORMAL HIGH (ref 26.6–33.0)
MCHC: 34.3 g/dL (ref 31.5–35.7)
MCV: 97 fL (ref 79–97)
Monocytes Absolute: 0.6 10*3/uL (ref 0.1–0.9)
Monocytes: 6 %
Neutrophils Absolute: 7.4 10*3/uL — ABNORMAL HIGH (ref 1.4–7.0)
Neutrophils: 67 %
Platelets: 342 10*3/uL (ref 150–450)
RBC: 4.74 x10E6/uL (ref 4.14–5.80)
RDW: 13.5 % (ref 11.6–15.4)
WBC: 11 10*3/uL — ABNORMAL HIGH (ref 3.4–10.8)

## 2019-05-27 LAB — LIPID PANEL
Chol/HDL Ratio: 4.3 ratio (ref 0.0–5.0)
Cholesterol, Total: 160 mg/dL (ref 100–199)
HDL: 37 mg/dL — ABNORMAL LOW (ref >39)
LDL Chol Calc (NIH): 100 mg/dL — ABNORMAL HIGH (ref 0–99)
Triglycerides: 126 mg/dL (ref 0–149)
VLDL Cholesterol Cal: 23 mg/dL (ref 5–40)

## 2019-05-27 LAB — COMPREHENSIVE METABOLIC PANEL
ALT: 24 IU/L (ref 0–44)
AST: 14 IU/L (ref 0–40)
Albumin/Globulin Ratio: 1.7 (ref 1.2–2.2)
Albumin: 4 g/dL (ref 3.8–4.9)
Alkaline Phosphatase: 103 IU/L (ref 39–117)
BUN/Creatinine Ratio: 6 — ABNORMAL LOW (ref 9–20)
BUN: 6 mg/dL (ref 6–24)
Bilirubin Total: 0.6 mg/dL (ref 0.0–1.2)
CO2: 23 mmol/L (ref 20–29)
Calcium: 9.5 mg/dL (ref 8.7–10.2)
Chloride: 105 mmol/L (ref 96–106)
Creatinine, Ser: 1.09 mg/dL (ref 0.76–1.27)
GFR calc Af Amer: 89 mL/min/{1.73_m2} (ref >59)
GFR calc non Af Amer: 77 mL/min/{1.73_m2} (ref >59)
Globulin, Total: 2.4 g/dL (ref 1.5–4.5)
Glucose: 98 mg/dL (ref 65–99)
Potassium: 4.8 mmol/L (ref 3.5–5.2)
Sodium: 141 mmol/L (ref 134–144)
Total Protein: 6.4 g/dL (ref 6.0–8.5)

## 2019-05-27 LAB — TSH: TSH: 1.8 u[IU]/mL (ref 0.450–4.500)

## 2019-06-11 ENCOUNTER — Ambulatory Visit: Payer: 59 | Admitting: Physician Assistant

## 2019-07-15 ENCOUNTER — Telehealth: Payer: Self-pay

## 2019-07-15 NOTE — Telephone Encounter (Signed)
Please have him start rosuvastatin 10 mg po daily, have him call us if he has recurrent symptoms, thank you

## 2019-07-16 MED ORDER — ROSUVASTATIN CALCIUM 10 MG PO TABS: 10.0000 mg | ORAL_TABLET | Freq: Every day | ORAL | 1 refills | Status: DC

## 2019-07-16 NOTE — Telephone Encounter (Signed)
Spoke with the pt and informed him that per Dr Meda Coffee, she would like for him to stop taking atorvastatin, due to lower extremity, knee pain while taking this med, and switch him to rosuvastatin 10 mg po daily instead, and call us back if he has reoccurring symptoms again.  Confirmed the pharmacy of choice with the pt.  Informed the pt that I will only send in a months supply, to make sure he tolerates this appropriately.  Pt verbalized understanding and agrees with this plan.  Updated atorvastatin in his allergies as an "intolerance."

## 2019-07-16 NOTE — Telephone Encounter (Signed)
Larry Rivera, CMA  [facility] Triage 21 hours ago (2:47 PM)   Pt called and want to know if you want him to continue with atorvastatin or will you prescribe another medication. Pt stopped taking for 2 weeks and knees are feeling much better. Pt expecting a return call. Thanks. [place]     Larry Rivera, CMA  You; Dorothy Spark, MD Yesterday (10:20 AM)   Pt called and want to know if you want him to continue with atorvastatin or will you prescribe another medication. Pt stopped taking for 2 weeks and knees are feeling much better. Pt expecting a return call. Thanks. [phone]

## 2019-08-08 ENCOUNTER — Other Ambulatory Visit: Payer: Self-pay | Admitting: Cardiology

## 2019-08-08 NOTE — Telephone Encounter (Signed)
Called patient before refilling medications to see if he is tolerating okay on Rosuvastatin 10 mg . Patient states he's doing fine and can take some more refills.  Patient briefly mentioned about lab work back in 05-2019 that was drawn and no one contacted back about and wanted to make sure he didn't fall through any cracks.Patient was reassure that lab work was fine and his  levels fall in appropriate ranges. He doesnt have Mychart so he couldn't access it.  Patient was explained that with normal result typically nurses/cma doesn't call back unless ranges are abnormal etc.Patient was happy with feed back and told to have a happy holidays.

## 2020-03-23 ENCOUNTER — Telehealth: Payer: Self-pay | Admitting: Cardiology

## 2020-03-23 NOTE — Telephone Encounter (Signed)
Spoke with pt and advised Larry Rivera is currently out of the office until 03/24/2020.  Pt states he only needs to speak with [name] and it is not urgent so he will wait until she returns to the office.  Pt advised will forward message to RN to be contacted when she returns.  Pt verbalizes understanding and agrees with current plan. `

## 2020-03-23 NOTE — Telephone Encounter (Signed)
    Pt would like to get a callback from [facility]

## 2020-03-24 NOTE — Telephone Encounter (Signed)
Spoke with the pt and he was calling to inquire if we could move his yearly appointment with Dr. Delton See scheduled for 11/29, to a sooner date in [place]. 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   Cancelled the pts 11/29 appointment with Dr. Delton See and moved this up to May 05, 2020 at 3:20 pm.  Pt aware to arrive 15 mins prior to this appt.  Pt will bring any forms that need to be filled out by Dr. Delton See, to this appointment. Pt verbalized understanding and agrees with this plan. Pt was more than gracious for all the assistance provided.

## 2020-04-22 ENCOUNTER — Other Ambulatory Visit: Payer: Self-pay | Admitting: Cardiology

## 2020-04-22 DIAGNOSIS — I493 Ventricular premature depolarization: Secondary | ICD-10-CM

## 2020-04-22 DIAGNOSIS — E782 Mixed hyperlipidemia: Secondary | ICD-10-CM

## 2020-04-22 DIAGNOSIS — I10 Essential (primary) hypertension: Secondary | ICD-10-CM

## 2020-05-03 ENCOUNTER — Other Ambulatory Visit: Payer: Self-pay | Admitting: Cardiology

## 2020-05-04 NOTE — Telephone Encounter (Signed)
Loa Socks, LPN     08:02 PM Note Spoke with the pt and informed him that per Dr Delton See, she would like for him to stop taking atorvastatin, due to lower extremity, knee pain while taking this med, and switch him to rosuvastatin 10 mg po daily instead, and call us back if he has reoccurring symptoms again.  Confirmed the pharmacy of choice with the pt.  Informed the pt that I will only send in a months supply, to make sure he tolerates this appropriately.  Pt verbalized understanding and agrees with this plan.  Updated atorvastatin in his allergies as an "intolerance."

## 2020-05-05 ENCOUNTER — Ambulatory Visit: Payer: 59 | Admitting: Cardiology

## 2020-05-05 ENCOUNTER — Encounter: Payer: Self-pay | Admitting: Cardiology

## 2020-05-05 ENCOUNTER — Encounter: Payer: Self-pay | Admitting: *Deleted

## 2020-05-05 ENCOUNTER — Other Ambulatory Visit: Payer: Self-pay

## 2020-05-05 VITALS — BP 120/80 | HR 69 | Ht 74.0 in | Wt 235.0 lb

## 2020-05-05 DIAGNOSIS — I10 Essential (primary) hypertension: Secondary | ICD-10-CM

## 2020-05-05 DIAGNOSIS — E782 Mixed hyperlipidemia: Secondary | ICD-10-CM

## 2020-05-05 NOTE — Progress Notes (Signed)
Cardiology Office Note    Date:  05/05/2020  ID:  Larry Rivera, DOB 1966/08/10, MRN [id number] PCP:  Myrlene Broker, MD  Cardiologist:  Dr. Delton See   Chief Complaint: 1 year follow up  History of Present Illness:  Larry Rivera is a 54 y.o. male with history of PVCs/paroxysmal VT s/p ablation 06/2014, hyperlipidemia, hyperglycemia, diastolic dysfunction without CHF, obesity who presents for 6 month checkup. He developed monomorphic VT during a stress test in 2008. Subsequent workup was unrevealing as he had preserved left ventricular function and no obstructive coronary artery disease. Cardiac MRI showed normal cardiac chambers, EF 58% without wall motion abnormalities, no evidence of RV dysplasia or fatty infiltration of the right ventricular free wall. In 2015, he was found to have frequent PVCs by EKG and wore a Holter monitor demonstrating 47,000 PVCs in a 24-hour period. TSH wnl. He had ectopy despite BB titration therefore underwent PVC ablation. Last echo 01/23/15 showed mild LVH, EF 55-60%, diastolic dysfunction, mild biatrial enlargement, normal LV filling pressure.   The patient is coming after a year, he states that he has been doing really well, he works full-time, and keeps himself very busy in his yard, has no symptoms of chest pain or shortness of breath, no palpitations since his ablation, he has been tolerating his medications well.  In the past he developed knee pain with Crestor, when he stopped it it resolved, so he restarted and he is asymptomatic.  Past Medical History:  Diagnosis Date  . Allergic rhinitis due to pollen   . Frequent PVCs    a. s/p PVC ablation 2015.  Marland Kitchen Hypertriglyceridemia   . Insomnia, unspecified   . Obesity   . Other and unspecified hyperlipidemia   . Paroxysmal ventricular tachycardia (HCC)    a. 2008 - monomorphic VT during stress test - cath with clean coronaries.  Marland Kitchen Psychosexual dysfunction with inhibited sexual excitement   .  Unspecified part of closed fracture of clavicle    Past Surgical History:  Procedure Laterality Date  . ABLATION  06/17/14   PVC ablation by Dr Ladona Ridgel - originating from the base of the left ventricle at approximately 12:00 just anterior to the mitral valve annulus  . CYSTECTOMY     Back of neck  . RHINOPLASTY     Deviated septum  . V-TACH ABLATION N/A 06/17/2014   Procedure: PVC ABLATION;  Surgeon: Marinus Maw, MD;  Location: [facility] CATH LAB;  Service: Cardiovascular;  Laterality: N/A;   Current Medications: Current Meds  Medication Sig  . metoprolol tartrate (LOPRESSOR) 25 MG tablet TAKE 1 TABLET BY MOUTH TWICE A DAY. MUST KEEP UPCOMING APPT FOR FURTHER REFILLS  . Omega-3 Fatty Acids (FISH OIL) 1000 MG CAPS Take 2 capsules (2,000 mg total) by mouth 2 (two) times daily.  . rosuvastatin (CRESTOR) 10 MG tablet TAKE 1 TABLET BY MOUTH EVERY DAY    Allergies:   Atorvastatin and Penicillins   Social History   Socioeconomic History  . Marital status: Married    Spouse name: Not on file  . Number of children: 1  . Years of education: 38  . Highest education level: Not on file  Occupational History  . Occupation: truck Air traffic controller: [employer]  Tobacco Use  . Smoking status: Current Every Day Smoker    Packs/day: 0.50    Years: 30.00    Pack years: 15.00    Types: Cigars  . Smokeless tobacco: Never Used  .  Tobacco comment: Cigar Only: not every day.  Vaping Use  . Vaping Use: Never used  Substance and Sexual Activity  . Alcohol use: Yes    Comment: rare drink  . Drug use: No  . Sexual activity: Yes    Partners: Female  Other Topics Concern  . Not on file  Social History Narrative   HSG. Married '91. 1 dtr '95. Work - Naval architect - long haul.   Social Determinants of Health   Financial Resource Strain:   . Difficulty of Paying Living Expenses: Not on file  Food Insecurity:   . Worried About Programme researcher, broadcasting/film/video in the Last Year: Not on file  . Ran Out of Food  in the Last Year: Not on file  Transportation Needs:   . Lack of Transportation (Medical): Not on file  . Lack of Transportation (Non-Medical): Not on file  Physical Activity:   . Days of Exercise per Week: Not on file  . Minutes of Exercise per Session: Not on file  Stress:   . Feeling of Stress : Not on file  Social Connections:   . Frequency of Communication with Friends and Family: Not on file  . Frequency of Social Gatherings with Friends and Family: Not on file  . Attends Religious Services: Not on file  . Active Member of Clubs or Organizations: Not on file  . Attends Banker Meetings: Not on file  . Marital Status: Not on file    Family History:  Family History  Problem Relation Age of Onset  . Rheum arthritis Mother   . Diabetes Mother        neuropathy  . Hyperlipidemia Father   . Diabetes Sister   . Lung disease Maternal Grandmother   . Stroke Maternal Grandmother   . Cancer Neg Hx   . Heart disease Neg Hx    ROS:   Please see the history of present illness.  All other systems are reviewed and otherwise negative.   PHYSICAL EXAM:   VS:  BP 120/80   Pulse 69   Ht 6\' 2"  (1.88 m)   Wt 235 lb (106.6 kg)   SpO2 97%   BMI 30.17 kg/m   BMI: Body mass index is 30.17 kg/m. GEN: Well nourished, well developed WM, in no acute distress  HEENT: normocephalic, atraumatic Neck: no JVD, carotid bruits, or masses Cardiac: RRR; no murmurs, rubs, or gallops, no edema  Respiratory:  clear to auscultation bilaterally, normal work of breathing GI: soft, nontender, nondistended, + BS MS: no deformity or atrophy  Skin: warm and dry, no rash Neuro:  Alert and Oriented x 3, Strength and sensation are intact, follows commands Psych: euthymic mood, full affect  Wt Readings from Last 3 Encounters:  05/05/20 235 lb (106.6 kg)  05/21/19 227 lb 12.8 oz (103.3 kg)  05/03/18 235 lb (106.6 kg)    Studies/Labs Reviewed:   EKG:  EKG was ordered today and personally  reviewed by me and demonstrates normal sinus rhythm, 62 bpm normal EKG, unchanged from prior.  Recent Labs: 05/27/2019: ALT 24; BUN 6; Creatinine, Ser 1.09; Hemoglobin 15.7; Platelets 342; Potassium 4.8; Sodium 141; TSH 1.800   Lipid Panel    Component Value Date/Time   CHOL 160 05/27/2019 0846   TRIG 126 05/27/2019 0846   HDL 37 (L) 05/27/2019 0846   CHOLHDL 4.3 05/27/2019 0846   CHOLHDL 5 02/02/2017 0812   VLDL 34.2 02/02/2017 0812   LDLCALC 100 (H) 05/27/2019 07/27/2019  LDLDIRECT 169.0 12/18/2015 5176   Additional studies/ records that were reviewed today include: Summarized above, also:  [facility] 2008: ANGIOGRAPHIC DATA:  1. Ventriculography done in the RAO projection.  Overall systolic      function appears at the low normal range.  Estimated ejection      fraction would be in the 50% range.  Does not appear to be      significant regurgitation.  There is not a definite wall motion      abnormality.  2. The left main is free of critical disease.  3. The LAD is a large vessel with perhaps some mild ectasia.  There is      minimal luminal irregularity.  There are two diagonal branches, all      of which appear free of critical disease.  4. The circumflex provides a large marginal with multiple branches.      It is free of critical disease.  5. Right coronary is large-caliber vessel without significant focal      narrowing in multiple posterolateral branches.  CONCLUSION:  1. Low normal ejection fraction.  2. No significant coronary obstruction.  3. Exercise-induced ventricular tachycardia of uncertain etiology.   ASSESSMENT & PLAN:   1. PVCs/paroxysmal VT on a treadmill test in 2008-  s/p ablation.  The patient is asymptomatic, he is EKG is normal today, echocardiogram in 2016 showed LVEF 55 to 60% no significant valvular abnormalities. 2. Hyperlipidemia -he is tolerating rosuvastatin well, and his most recent lipids in October 2020 were at goal. 3. Hypertension -  well  controlled. Continue metoprolol. 4. DOT physical -we provided letter for his work.           Disposition: F/u with Dr. Delton See in 1 year.  Medication Adjustments/Labs and Tests Ordered: Current medicines are reviewed at length with the patient today.  Concerns regarding medicines are outlined above. Medication changes, Labs and Tests ordered today are summarized above and listed in the Patient Instructions accessible in Encounters.   Signed, Tobias Alexander, MD  05/05/2020 4:47 PM    [facility] [address] [place], [place], [place]  [address] Phone: [phone]; Fax: [phone]

## 2020-05-05 NOTE — Patient Instructions (Signed)
Medication Instructions:  ° °Your physician recommends that you continue on your current medications as directed. Please refer to the Current Medication list given to you today. ° °*If you need a refill on your cardiac medications before your next appointment, please call your pharmacy* ° °Follow-Up: °At [facility], you and your health needs are our priority.  As part of our continuing mission to provide you with exceptional heart care, we have created designated Provider Care Teams.  These Care Teams include your primary Cardiologist (physician) and Advanced Practice Providers (APPs -  Physician Assistants and Nurse Practitioners) who all work together to provide you with the care you need, when you need it. ° °We recommend signing up for the patient portal called "MyChart".  Sign up information is provided on this After Visit Summary.  MyChart is used to connect with patients for Virtual Visits (Telemedicine).  Patients are able to view lab/test results, encounter notes, upcoming appointments, etc.  Non-urgent messages can be sent to your provider as well.   °To learn more about what you can do with MyChart, go to https://www.mychart.com.   ° °Your next appointment:   °12 month(s) ° °The format for your next appointment:   °In Person ° °Provider:   °Katarina Nelson, MD ° ° ° ° °

## 2020-06-02 ENCOUNTER — Other Ambulatory Visit: Payer: Self-pay | Admitting: Cardiology

## 2020-06-02 DIAGNOSIS — I493 Ventricular premature depolarization: Secondary | ICD-10-CM

## 2020-06-02 DIAGNOSIS — E782 Mixed hyperlipidemia: Secondary | ICD-10-CM

## 2020-06-02 DIAGNOSIS — I10 Essential (primary) hypertension: Secondary | ICD-10-CM

## 2020-07-13 ENCOUNTER — Ambulatory Visit: Payer: 59 | Admitting: Cardiology

## 2020-07-31 ENCOUNTER — Other Ambulatory Visit: Payer: Self-pay | Admitting: Cardiology

## 2020-10-09 ENCOUNTER — Telehealth: Payer: Self-pay | Admitting: Cardiology

## 2020-10-09 NOTE — Telephone Encounter (Signed)
Pt was calling to discuss Dr. Delton See leaving and who will be taking over his care, and where I will be going. Informed the pt that he has been assigned to Dr. Shari Prows as his new Cardiologist, here at our [facility] office. Informed the pt that he will be due to see her sometime in September or October, as he was initially due to see Dr. Delton See at that time.  Pt is seen yearly for cardiac follow-up and for DOT clearance.  He request to see Dr. 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  Pt states when the schedulers call him sometime this summer to schedule his next follow-up appt, he will specify to them that he would like to be seen in Oct vs Sept.  Pt verbalized understanding and agrees with this plan. Pt verbalized reassurance knowing he has been assigned to Dr. Shari Prows, and I will be working with her.  Pt was more than gracious for all the assistance provided.

## 2020-10-09 NOTE — Telephone Encounter (Signed)
Patient called and wanted to speak directly to [facility]. Patient said it was nothing urgent but he would like to speak directly to [facility]

## 2020-10-09 NOTE — Telephone Encounter (Signed)
Left the pt a message to call the office back. 

## 2020-10-09 NOTE — Telephone Encounter (Signed)
Follow Up:      Pt is returning [name]'s call from this morning.

## 2021-02-01 ENCOUNTER — Telehealth: Payer: Self-pay | Admitting: Cardiology

## 2021-02-01 NOTE — Telephone Encounter (Signed)
Spoke with the pt.  He was calling to get his appt with Dr. Shari Prows scheduled, to establish with her and be seen, before she goes out on maternity leave. Pt is a former Dr. Delton See pt. Pt will be needing DOT clearance for early September, but wants to see Dr. Shari Prows for this, as well as meet and establish with her.  Scheduled the pt to come in and see Dr. Shari Prows for 7/22 at 0840, at our [facility].  Pt was made aware of [place] our [facility] is, and is familiar with the area.  Pt will see Dr. Shari Prows at [facility] on 7/22 at 0840.  He is aware to arrive 15 mins prior to this appt.  Pt verbalized understanding and agrees with this plan. Pt was more than gracious for all the assistance provided.

## 2021-02-01 NOTE — Telephone Encounter (Signed)
Left the pt a message to call the office back. 

## 2021-02-01 NOTE — Telephone Encounter (Signed)
    Pt is requesting to speak with Lajoyce Corners, he said Lajoyce Corners knows him well and he has some few questions

## 2021-03-03 NOTE — Progress Notes (Deleted)
Cardiology Office Note:    Date:  03/03/2021   ID:  [name], DOB 02-05-1966, MRN [id number]  PCP:  Myrlene Broker, MD   [facility] Providers Cardiologist:  Tobias Alexander, MD (Inactive) {     Referring MD: Myrlene Broker, *    History of Present Illness:    Larry Rivera is a 55 y.o. male with a hx of PVCs/paroxysmal VT s/p ablation 06/2014, hyperlipidemia, diastolic dysfunction without CHF, and obesity who was previously followed by Dr. Delton See who now presents to clinic for follow-up.  Per review of the record, the patient developed monomorphic VT during a stress test in 2008. Subsequent workup was unrevealing as he had preserved left ventricular function and no obstructive coronary artery disease. Cardiac MRI showed normal cardiac chambers, EF 58% without wall motion abnormalities, no evidence of RV dysplasia or fatty infiltration of the right ventricular free wall. In 2015, he was found to have frequent PVCs by EKG and wore a Holter monitor demonstrating 47,000 PVCs in a 24-hour period. TSH wnl. He had ectopy despite BB titration therefore underwent PVC ablation. Last echo 01/23/15 showed mild LVH, EF 55-60%, diastolic dysfunction, mild biatrial enlargement, normal LV filling pressure.   Last saw Dr. Delton See in 04/2020 where he was doing very well. No HF or anginal symptoms.   Past Medical History:  Diagnosis Date   Allergic rhinitis due to pollen    Frequent PVCs    a. s/p PVC ablation 2015.   Hypertriglyceridemia    Insomnia, unspecified    Obesity    Other and unspecified hyperlipidemia    Paroxysmal ventricular tachycardia (HCC)    a. 2008 - monomorphic VT during stress test - cath with clean coronaries.   Psychosexual dysfunction with inhibited sexual excitement    Unspecified part of closed fracture of clavicle     Past Surgical History:  Procedure Laterality Date   ABLATION  06/17/14   PVC ablation by Dr Ladona Ridgel - originating from the base of the  left ventricle at approximately 12:00 just anterior to the mitral valve annulus   CYSTECTOMY     Back of neck   RHINOPLASTY     Deviated septum   V-TACH ABLATION N/A 06/17/2014   Procedure: PVC ABLATION;  Surgeon: Marinus Maw, MD;  Location: [facility] CATH LAB;  Service: Cardiovascular;  Laterality: N/A;    Current Medications: No outpatient medications have been marked as taking for the 03/05/21 encounter (Appointment) with [name], MD.     Allergies:   Atorvastatin and Penicillins   Social History   Socioeconomic History   Marital status: Married    Spouse name: Not on file   Number of children: 1   Years of education: 12   Highest education level: Not on file  Occupational History   Occupation: truck Air traffic controller: [employer]  Tobacco Use   Smoking status: Every Day    Packs/day: 0.50    Years: 30.00    Pack years: 15.00    Types: Cigars, Cigarettes   Smokeless tobacco: Never   Tobacco comments:    Cigar Only: not every day.  Vaping Use   Vaping Use: Never used  Substance and Sexual Activity   Alcohol use: Yes    Comment: rare drink   Drug use: No   Sexual activity: Yes    Partners: Female  Other Topics Concern   Not on file  Social History Narrative   HSG. Married '91.  1 dtr '95. Work - Naval architect - long haul.   Social Determinants of Health   Financial Resource Strain: Not on file  Food Insecurity: Not on file  Transportation Needs: Not on file  Physical Activity: Not on file  Stress: Not on file  Social Connections: Not on file     Family History: The patient's ***family history includes Diabetes in his mother and sister; Hyperlipidemia in his father; Lung disease in his maternal grandmother; Rheum arthritis in his mother; Stroke in his maternal grandmother. There is no history of Cancer or Heart disease.  ROS:   Please see the history of present illness.    *** All other systems reviewed and are negative.  EKGs/Labs/Other Studies  Reviewed:    The following studies were reviewed today: TTE February 10, 2015: Study Conclusions   - Left ventricle: The cavity size was normal. Wall thickness was    increased in a pattern of mild LVH. Systolic function was normal.    The estimated ejection fraction was in the range of 55% to 60%.    Wall motion was normal; there were no regional wall motion    abnormalities. Doppler parameters are consistent with abnormal    left ventricular relaxation (grade 1 diastolic dysfunction). The    E/e&' ratio is <8, suggesting normal LV filling pressure.  - Left atrium: Mildly dilated at 35 ml/m2.  - Right atrium: The atrium was mildly dilated.  - Inferior vena cava: The vessel was normal in size. The    respirophasic diameter changes were in the normal range (>= 50%),    consistent with normal central venous pressure.   Impressions:   - LVEF 55-60%, mild LVH, normal wall motion, diastolic dysfunction,    normal LV filling pressure, mild biatrial enlargement, normal IVC    size.   LHC 2008: ANGIOGRAPHIC DATA:  1. Ventriculography done in the RAO projection.  Overall systolic      function appears at the low normal range.  Estimated ejection      fraction would be in the 50% range.  Does not appear to be      significant regurgitation.  There is not a definite wall motion      abnormality.  2. The left main is free of critical disease.  3. The LAD is a large vessel with perhaps some mild ectasia.  There is      minimal luminal irregularity.  There are two diagonal branches, all      of which appear free of critical disease.  4. The circumflex provides a large marginal with multiple branches.      It is free of critical disease.  5. Right coronary is large-caliber vessel without significant focal      narrowing in multiple posterolateral branches.   CONCLUSION:  1. Low normal ejection fraction.  2. No significant coronary obstruction.  3. Exercise-induced ventricular tachycardia of  uncertain etiology.    EKG:  EKG is *** ordered today.  The ekg ordered today demonstrates ***  Recent Labs: No results found for requested labs within last 8760 hours.  Recent Lipid Panel    Component Value Date/Time   CHOL 160 05/27/2019 0846   TRIG 126 05/27/2019 0846   HDL 37 (L) 05/27/2019 0846   CHOLHDL 4.3 05/27/2019 0846   CHOLHDL 5 02/02/2017 0812   VLDL 34.2 02/02/2017 0812   LDLCALC 100 (H) 05/27/2019 0846   LDLDIRECT 169.0 12/18/2015 0922     Risk Assessment/Calculations:   {Does this  patient have ATRIAL FIBRILLATION?:[phone]}       Physical Exam:    VS:  There were no vitals taken for this visit.    Wt Readings from Last 3 Encounters:  05/05/20 235 lb (106.6 kg)  05/21/19 227 lb 12.8 oz (103.3 kg)  05/03/18 235 lb (106.6 kg)     GEN: *** Well nourished, well developed in no acute distress HEENT: Normal NECK: No JVD; No carotid bruits LYMPHATICS: No lymphadenopathy CARDIAC: ***RRR, no murmurs, rubs, gallops RESPIRATORY:  Clear to auscultation without rales, wheezing or rhonchi  ABDOMEN: Soft, non-tender, non-distended MUSCULOSKELETAL:  No edema; No deformity  SKIN: Warm and dry NEUROLOGIC:  Alert and oriented x 3 PSYCHIATRIC:  Normal affect   ASSESSMENT:    No diagnosis found. PLAN:    In order of problems listed above:  #Paroxysmal VT/PVCs: Initially noted in 2008 now s/p ablation. Doing well without symptoms. TTE in 2016 with LVEF 55-60%, no significant valve disease. -Continue metoprolol 25mg  BID  #HLD: -Continue crestor 10mg  daily  #HTN: -Continue metop 25mg  BID   {Are you ordering a CV Procedure (e.g. stress test, cath, DCCV, TEE, etc)?   Press F2        :    Medication Adjustments/Labs and Tests Ordered: Current medicines are reviewed at length with the patient today.  Concerns regarding medicines are outlined above.  No orders of the defined types were placed in this encounter.  No orders of the defined types  were placed in this encounter.   There are no Patient Instructions on file for this visit.   Signed, , MD  03/03/2021 1:38 PM    [facility]

## 2021-03-04 NOTE — Progress Notes (Signed)
Cardiology Office Note:    Date:  03/05/2021   ID:  Larry Rivera, DOB 1966/06/09, MRN [id number]  PCP:  Larry Broker, MD   [facility] Providers Cardiologist:  Tobias Alexander, MD (Inactive) {   Referring MD: Larry Rivera, *   No chief complaint on file.   History of Present Illness:    Larry Rivera is a 55 y.o. male with a hx of PVCs/paroxysmal VT s/p ablation 06/2014, hyperlipidemia, diastolic dysfunction without CHF, and obesity who was previously followed by Dr. Delton See who now presents to clinic for follow-up.   Per review of the record, the patient developed monomorphic VT during a stress test in 2008. Subsequent workup was unrevealing as he had preserved left ventricular function and no obstructive coronary artery disease. Cardiac MRI showed normal cardiac chambers, EF 58% without wall motion abnormalities, no evidence of RV dysplasia or fatty infiltration of the right ventricular free wall. In 2015, he was found to have frequent PVCs by EKG and wore a Holter monitor demonstrating 47,000 PVCs in a 24-hour period. TSH wnl. He had ectopy despite BB titration therefore underwent PVC ablation. Last echo 01/23/15 showed mild LVH, EF 55-60%, diastolic dysfunction, mild biatrial enlargement, normal LV filling pressure.    Last saw Dr. Delton See in 04/2020 where he was doing very well. No HF or anginal symptoms.   Today, he is doing well overall. He is not having any chest pains or palpitations. Tolerating his crestor without issues. No significant palpitations and has been taking his metoprolol as prescribed. Blood pressure is very well controlled.   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   He denies any shortness of breath, or exertional symptoms. No headaches, lightheadedness, or syncope to report. Also has no lower extremity edema, orthopnea or PND.   Past Medical History:  Diagnosis Date   Allergic  rhinitis due to pollen    Frequent PVCs    a. s/p PVC ablation 2015.   Hypertriglyceridemia    Insomnia, unspecified    Obesity    Other and unspecified hyperlipidemia    Paroxysmal ventricular tachycardia (HCC)    a. 2008 - monomorphic VT during stress test - cath with clean coronaries.   Psychosexual dysfunction with inhibited sexual excitement    Unspecified part of closed fracture of clavicle     Past Surgical History:  Procedure Laterality Date   ABLATION  06/17/14   PVC ablation by Dr Ladona Ridgel - originating from the base of the left ventricle at approximately 12:00 just anterior to the mitral valve annulus   CYSTECTOMY     Back of neck   RHINOPLASTY     Deviated septum   V-TACH ABLATION N/A 06/17/2014   Procedure: PVC ABLATION;  Surgeon: Marinus Maw, MD;  Location: [facility] CATH LAB;  Service: Cardiovascular;  Laterality: N/A;    Current Medications: Current Meds  Medication Sig   Omega-3 Fatty Acids (FISH OIL) 1000 MG CAPS Take 2 capsules (2,000 mg total) by mouth 2 (two) times daily.   [DISCONTINUED] metoprolol tartrate (LOPRESSOR) 25 MG tablet Take 1 tablet (25 mg total) by mouth 2 (two) times daily. TAKE 1 TABLET BY MOUTH TWICE A DAY. MUST KEEP UPCOMING APPT FOR FURTHER REFILLS   [DISCONTINUED] rosuvastatin (CRESTOR) 10 MG tablet TAKE 1 TABLET BY MOUTH EVERY DAY     Allergies:   Atorvastatin and Penicillins   Social History   Socioeconomic History  Marital status: Married    Spouse name: Not on file   Number of children: 1   Years of education: 64   Highest education level: Not on file  Occupational History   Occupation: truck Air traffic controller: [employer]  Tobacco Use   Smoking status: Every Day    Packs/day: 0.50    Years: 30.00    Pack years: 15.00    Types: Cigars, Cigarettes   Smokeless tobacco: Never   Tobacco comments:    Cigar Only: not every day.  Vaping Use   Vaping Use: Never used  Substance and Sexual Activity   Alcohol use: Yes     Comment: rare drink   Drug use: No   Sexual activity: Yes    Partners: Female  Other Topics Concern   Not on file  Social History Narrative   HSG. Married '91. 1 dtr '95. Work - Naval architect - long haul.   Social Determinants of Health   Financial Resource Strain: Not on file  Food Insecurity: Not on file  Transportation Needs: Not on file  Physical Activity: Not on file  Stress: Not on file  Social Connections: Not on file     Family History: The patient's family history includes Diabetes in his mother and sister; Hyperlipidemia in his father; Lung disease in his maternal grandmother; Rheum arthritis in his mother; Stroke in his maternal grandmother. There is no history of Cancer or Heart disease.  ROS:   Review of Systems  Constitutional:  Negative for fever and malaise/fatigue.  HENT:  Negative for congestion and hearing loss.   Eyes:  Negative for pain and redness.  Respiratory:  Negative for hemoptysis and shortness of breath.   Cardiovascular:  Negative for chest pain, palpitations, orthopnea, claudication, leg swelling and PND.  Gastrointestinal:  Negative for abdominal pain and heartburn.  Genitourinary:  Negative for frequency and urgency.  Musculoskeletal:  Negative for back pain and neck pain.  Neurological:  Negative for dizziness and weakness.  Endo/Heme/Allergies:  Negative for polydipsia.  Psychiatric/Behavioral:  Negative for hallucinations and suicidal ideas.     EKGs/Labs/Other Studies Reviewed:    The following studies were reviewed today:  Echo 01/23/2015 - Left ventricle: The cavity size was normal. Wall thickness was    increased in a pattern of mild LVH. Systolic function was normal.    The estimated ejection fraction was in the range of 55% to 60%.    Wall motion was normal; there were no regional wall motion    abnormalities. Doppler parameters are consistent with abnormal    left ventricular relaxation (grade 1 diastolic dysfunction). The     E/e&' ratio is <8, suggesting normal LV filling pressure.  - Left atrium: Mildly dilated at 35 ml/m2.  - Right atrium: The atrium was mildly dilated.  - Inferior vena cava: The vessel was normal in size. The    respirophasic diameter changes were in the normal range (>= 50%),    consistent with normal central venous pressure.   Impressions:  - LVEF 55-60%, mild LVH, normal wall motion, diastolic dysfunction,    normal LV filling pressure, mild biatrial enlargement, normal IVC    size.   Cardiac MRI 10/08/2014: FINDINGS: 1. Mildly dilated left ventricle with normal thickness and normal systolic function (LVEF = 60%).   No regional wall motion abnormalities.   LVEDD:  53 mm   LVESD:  32 mm   LVEDV:  153 ml   LVESV:  61 ml  SV:  92 ml   CO:  6.5 L/minute   Myocardial mass:  158 g   2. Normal right ventricular size, thickness and systolic function (RVEF = 50%). No regional wall motion abnormalities.   RVEDV:  183 ml   RVESV:  92 ml   SV:  91 ml   CO:  6.5 L/minute   3.  Mild left atrial dilatation.   4.  Mild mitral regurgitation, trivial tricuspid regurgitation.   5. No evidence of late gadolinium enhancement in the myocardium of the left ventricle.   6.  Normal caliber of the aortic root, aorta and pulmonary artery.  IMPRESSION: 1. Mildly dilated left ventricle with normal thickness and normal systolic function (LVEF = 60%).   No regional wall motion abnormalities.   2. Normal right ventricular size, thickness and systolic function (RVEF = 50%). No regional wall motion abnormalities.   3.  Mild left atrial dilatation.   4.  Mild mitral regurgitation, trivial tricuspid regurgitation.   5. No evidence of late gadolinium enhancement in the myocardium of the left ventricle.  EKG:   03/05/2021: Sinus bradycardia, HR 56 bpm  Recent Labs: No results found for requested labs within last 8760 hours.  Recent Lipid Panel    Component Value Date/Time    CHOL 160 05/27/2019 0846   TRIG 126 05/27/2019 0846   HDL 37 (L) 05/27/2019 0846   CHOLHDL 4.3 05/27/2019 0846   CHOLHDL 5 02/02/2017 0812   VLDL 34.2 02/02/2017 0812   LDLCALC 100 (H) 05/27/2019 0846   LDLDIRECT 169.0 12/18/2015 0922     Risk Assessment/Calculations:           Physical Exam:    VS:  BP 116/68   Pulse (!) 56   Ht 6\' 2"  (1.88 m)   Wt 237 lb (107.5 kg)   BMI 30.43 kg/m     Wt Readings from Last 3 Encounters:  03/05/21 237 lb (107.5 kg)  05/05/20 235 lb (106.6 kg)  05/21/19 227 lb 12.8 oz (103.3 kg)     GEN: Well nourished, well developed in no acute distress HEENT: Normal NECK: No JVD; No carotid bruits LYMPHATICS: No lymphadenopathy CARDIAC: Bradycardic, regular rhythm, no murmurs, rubs, gallops RESPIRATORY:  Clear to auscultation without rales, wheezing or rhonchi  ABDOMEN: Soft, non-tender, non-distended MUSCULOSKELETAL:  No edema; No deformity  SKIN: Warm and dry NEUROLOGIC:  Alert and oriented x 3 PSYCHIATRIC:  Normal affect   ASSESSMENT:    1. Medication management   2. Essential hypertension   3. PVC (premature ventricular contraction)   4. Mixed hyperlipidemia    PLAN:    In order of problems listed above:  #Paroxysmal VT/PVCs: Initially noted in 2008 now s/p ablation. Doing well without symptoms. TTE in 2016 with LVEF 55-60%, no significant valve disease. -Continue metoprolol 25mg  BID  #HLD: -Continue crestor 10mg  daily -Repeat lipids at [address] office; goal LDL<100  #HTN: Well controlled and at goal -Continue metop 25mg  BID      Follow-up in 1 year.  Medication Adjustments/Labs and Tests Ordered: Current medicines are reviewed at length with the patient today.  Concerns regarding medicines are outlined above.  Orders Placed This Encounter  Procedures   Lipid Profile   EKG 12-Lead    Meds ordered this encounter  Medications   metoprolol tartrate (LOPRESSOR) 25 MG tablet    Sig: Take 1 tablet (25 mg total)  by mouth 2 (two) times daily.    Dispense:  180 tablet  Refill:  3   rosuvastatin (CRESTOR) 10 MG tablet    Sig: Take 1 tablet (10 mg total) by mouth daily.    Dispense:  90 tablet    Refill:  3     Patient Instructions  Medication Instructions:   Your physician recommends that you continue on your current medications as directed. Please refer to the Current Medication list given to you today.  *If you need a refill on your cardiac medications before your next appointment, please call your pharmacy*   Lab Work:  NEXT WEEK AT [address] OFFICE--CHECK LIPIDS--PLEASE COME FASTING TO THIS LAB APPOINTMENT  If you have labs (blood work) drawn today and your tests are completely normal, you will receive your results only by: MyChart Message (if you have MyChart) OR A paper copy in the mail If you have any lab test that is abnormal or we need to change your treatment, we will call you to review the results.   Follow-Up: At [facility], you and your health needs are our priority.  As part of our continuing mission to provide you with exceptional heart care, we have created designated Provider Care Teams.  These Care Teams include your primary Cardiologist (physician) and Advanced Practice Providers (APPs -  Physician Assistants and Nurse Practitioners) who all work together to provide you with the care you need, when you need it.  We recommend signing up for the patient portal called "MyChart".  Sign up information is provided on this After Visit Summary.  MyChart is used to connect with patients for Virtual Visits (Telemedicine).  Patients are able to view lab/test results, encounter notes, upcoming appointments, etc.  Non-urgent messages can be sent to your provider as well.   To learn more about what you can do with MyChart, go to [email or web address].    Your next appointment:   1 year(s)  The format for your next appointment:   In Person  Provider:   Laurance Flatten,  MD      [facility],acting as a scribe for [name], MD.,have documented all relevant documentation on the behalf of [name], MD,as directed by  [name], MD while in the presence of [name], MD.  I, [name], MD, have reviewed all documentation for this visit. The documentation on 03/05/21 for the exam, diagnosis, procedures, and orders are all accurate and complete.   Signed, [name], MD  03/05/2021 9:04 AM    [facility]

## 2021-03-05 ENCOUNTER — Other Ambulatory Visit: Payer: Self-pay

## 2021-03-05 ENCOUNTER — Encounter: Payer: Self-pay | Admitting: *Deleted

## 2021-03-05 ENCOUNTER — Ambulatory Visit (HOSPITAL_BASED_OUTPATIENT_CLINIC_OR_DEPARTMENT_OTHER): Payer: 59 | Admitting: Cardiology

## 2021-03-05 VITALS — BP 116/68 | HR 56 | Ht 74.0 in | Wt 237.0 lb

## 2021-03-05 DIAGNOSIS — E782 Mixed hyperlipidemia: Secondary | ICD-10-CM

## 2021-03-05 DIAGNOSIS — I10 Essential (primary) hypertension: Secondary | ICD-10-CM | POA: Diagnosis not present

## 2021-03-05 DIAGNOSIS — Z79899 Other long term (current) drug therapy: Secondary | ICD-10-CM | POA: Diagnosis not present

## 2021-03-05 DIAGNOSIS — I493 Ventricular premature depolarization: Secondary | ICD-10-CM | POA: Diagnosis not present

## 2021-03-05 MED ORDER — ROSUVASTATIN CALCIUM 10 MG PO TABS: 10.0000 mg | ORAL_TABLET | Freq: Every day | ORAL | 3 refills | Status: DC

## 2021-03-05 MED ORDER — METOPROLOL TARTRATE 25 MG PO TABS: 25.0000 mg | ORAL_TABLET | Freq: Two times a day (BID) | ORAL | 3 refills | Status: DC

## 2021-03-05 NOTE — Patient Instructions (Signed)
Medication Instructions:   Your physician recommends that you continue on your current medications as directed. Please refer to the Current Medication list given to you today.  *If you need a refill on your cardiac medications before your next appointment, please call your pharmacy*   Lab Work:  NEXT WEEK AT [address] OFFICE--CHECK LIPIDS--PLEASE COME FASTING TO THIS LAB APPOINTMENT  If you have labs (blood work) drawn today and your tests are completely normal, you will receive your results only by: MyChart Message (if you have MyChart) OR A paper copy in the mail If you have any lab test that is abnormal or we need to change your treatment, we will call you to review the results.   Follow-Up: At [facility], you and your health needs are our priority.  As part of our continuing mission to provide you with exceptional heart care, we have created designated Provider Care Teams.  These Care Teams include your primary Cardiologist (physician) and Advanced Practice Providers (APPs -  Physician Assistants and Nurse Practitioners) who all work together to provide you with the care you need, when you need it.  We recommend signing up for the patient portal called "MyChart".  Sign up information is provided on this After Visit Summary.  MyChart is used to connect with patients for Virtual Visits (Telemedicine).  Patients are able to view lab/test results, encounter notes, upcoming appointments, etc.  Non-urgent messages can be sent to your provider as well.   To learn more about what you can do with MyChart, go to [email or web address].    Your next appointment:   1 year(s)  The format for your next appointment:   In Person  Provider:   Laurance Flatten, MD

## 2021-03-08 ENCOUNTER — Other Ambulatory Visit: Payer: Self-pay

## 2021-03-08 ENCOUNTER — Other Ambulatory Visit: Payer: 59 | Admitting: *Deleted

## 2021-03-08 DIAGNOSIS — I10 Essential (primary) hypertension: Secondary | ICD-10-CM

## 2021-03-08 DIAGNOSIS — Z79899 Other long term (current) drug therapy: Secondary | ICD-10-CM

## 2021-03-08 DIAGNOSIS — I493 Ventricular premature depolarization: Secondary | ICD-10-CM

## 2021-03-08 DIAGNOSIS — E782 Mixed hyperlipidemia: Secondary | ICD-10-CM

## 2021-03-08 LAB — LIPID PANEL
Chol/HDL Ratio: 4.8 ratio (ref 0.0–5.0)
Cholesterol, Total: 162 mg/dL (ref 100–199)
HDL: 34 mg/dL — ABNORMAL LOW (ref >39)
LDL Chol Calc (NIH): 104 mg/dL — ABNORMAL HIGH (ref 0–99)
Triglycerides: 132 mg/dL (ref 0–149)
VLDL Cholesterol Cal: 24 mg/dL (ref 5–40)

## 2022-03-22 ENCOUNTER — Other Ambulatory Visit: Payer: Self-pay

## 2022-03-22 DIAGNOSIS — I10 Essential (primary) hypertension: Secondary | ICD-10-CM

## 2022-03-22 DIAGNOSIS — Z79899 Other long term (current) drug therapy: Secondary | ICD-10-CM

## 2022-03-22 DIAGNOSIS — I493 Ventricular premature depolarization: Secondary | ICD-10-CM

## 2022-03-22 DIAGNOSIS — E782 Mixed hyperlipidemia: Secondary | ICD-10-CM

## 2022-03-22 MED ORDER — ROSUVASTATIN CALCIUM 10 MG PO TABS: 10.0000 mg | ORAL_TABLET | Freq: Every day | ORAL | 0 refills | Status: DC

## 2022-05-03 ENCOUNTER — Other Ambulatory Visit: Payer: Self-pay | Admitting: *Deleted

## 2022-05-03 DIAGNOSIS — I10 Essential (primary) hypertension: Secondary | ICD-10-CM

## 2022-05-03 DIAGNOSIS — I493 Ventricular premature depolarization: Secondary | ICD-10-CM

## 2022-05-03 DIAGNOSIS — E782 Mixed hyperlipidemia: Secondary | ICD-10-CM

## 2022-05-03 DIAGNOSIS — Z79899 Other long term (current) drug therapy: Secondary | ICD-10-CM

## 2022-05-03 MED ORDER — METOPROLOL TARTRATE 25 MG PO TABS: 25.0000 mg | ORAL_TABLET | Freq: Two times a day (BID) | ORAL | 0 refills | Status: DC

## 2022-05-12 NOTE — Progress Notes (Unsigned)
Cardiology Office Note:    Date:  05/12/2022   ID:  Larry Rivera, DOB September 18, 1965, MRN [id number]  PCP:  Larry Broker, MD   [facility] Providers Cardiologist:  Tobias Alexander, MD {   Referring MD: Larry Rivera, *   No chief complaint on file.   History of Present Illness:    Larry Rivera is a 56 y.o. male with a hx of PVCs/paroxysmal VT s/p ablation 06/2014, hyperlipidemia, diastolic dysfunction without CHF, and obesity who was previously followed by Dr. Delton See who now presents to clinic for follow-up.   Per review of the record, the patient developed monomorphic VT during a stress test in 2008. Subsequent workup was unrevealing as he had preserved left ventricular function and no obstructive coronary artery disease. Cardiac MRI showed normal cardiac chambers, EF 58% without wall motion abnormalities, no evidence of RV dysplasia or fatty infiltration of the right ventricular free wall. In 2015, he was found to have frequent PVCs by EKG and wore a Holter monitor demonstrating 47,000 PVCs in a 24-hour period. TSH wnl. He had ectopy despite BB titration therefore underwent PVC ablation. Last echo 01/23/15 showed mild LVH, EF 55-60%, diastolic dysfunction, mild biatrial enlargement, normal LV filling pressure.    Last seen in clinic on 03/05/21 where he was doing well from a CV standpoint.   Today, ***   Past Medical History:  Diagnosis Date   Allergic rhinitis due to pollen    Frequent PVCs    a. s/p PVC ablation 2015.   Hypertriglyceridemia    Insomnia, unspecified    Obesity    Other and unspecified hyperlipidemia    Paroxysmal ventricular tachycardia (HCC)    a. 2008 - monomorphic VT during stress test - cath with clean coronaries.   Psychosexual dysfunction with inhibited sexual excitement    Unspecified part of closed fracture of clavicle     Past Surgical History:  Procedure Laterality Date   ABLATION  06/17/14   PVC ablation by Dr Ladona Ridgel -  originating from the base of the left ventricle at approximately 12:00 just anterior to the mitral valve annulus   CYSTECTOMY     Back of neck   RHINOPLASTY     Deviated septum   V-TACH ABLATION N/A 06/17/2014   Procedure: PVC ABLATION;  Surgeon: Marinus Maw, MD;  Location: [facility] CATH LAB;  Service: Cardiovascular;  Laterality: N/A;    Current Medications: No outpatient medications have been marked as taking for the 05/16/22 encounter (Appointment) with [name], MD.     Allergies:   Atorvastatin and Penicillins   Social History   Socioeconomic History   Marital status: Married    Spouse name: Not on file   Number of children: 1   Years of education: 12   Highest education level: Not on file  Occupational History   Occupation: truck Air traffic controller: [employer]  Tobacco Use   Smoking status: Every Day    Packs/day: 0.50    Years: 30.00    Total pack years: 15.00    Types: Cigars, Cigarettes   Smokeless tobacco: Never   Tobacco comments:    Cigar Only: not every day.  Vaping Use   Vaping Use: Never used  Substance and Sexual Activity   Alcohol use: Yes    Comment: rare drink   Drug use: No   Sexual activity: Yes    Partners: Female  Other Topics Concern   Not on file  Social History Narrative   HSG. Married '91. 1 dtr '95. Work - Naval architect - long haul.   Social Determinants of Health   Financial Resource Strain: Not on file  Food Insecurity: Not on file  Transportation Needs: Not on file  Physical Activity: Not on file  Stress: Not on file  Social Connections: Not on file     Family History: The patient's family history includes Diabetes in his mother and sister; Hyperlipidemia in his father; Lung disease in his maternal grandmother; Rheum arthritis in his mother; Stroke in his maternal grandmother. There is no history of Cancer or Heart disease.  ROS:   Review of Systems  Constitutional:  Negative for fever and malaise/fatigue.  HENT:   Negative for congestion and hearing loss.   Eyes:  Negative for pain and redness.  Respiratory:  Negative for hemoptysis and shortness of breath.   Cardiovascular:  Negative for chest pain, palpitations, orthopnea, claudication, leg swelling and PND.  Gastrointestinal:  Negative for abdominal pain and heartburn.  Genitourinary:  Negative for frequency and urgency.  Musculoskeletal:  Negative for back pain and neck pain.  Neurological:  Negative for dizziness and weakness.  Endo/Heme/Allergies:  Negative for polydipsia.  Psychiatric/Behavioral:  Negative for hallucinations and suicidal ideas.      EKGs/Labs/Other Studies Reviewed:    The following studies were reviewed today:  Echo 01/23/2015 - Left ventricle: The cavity size was normal. Wall thickness was    increased in a pattern of mild LVH. Systolic function was normal.    The estimated ejection fraction was in the range of 55% to 60%.    Wall motion was normal; there were no regional wall motion    abnormalities. Doppler parameters are consistent with abnormal    left ventricular relaxation (grade 1 diastolic dysfunction). The    E/e&' ratio is <8, suggesting normal LV filling pressure.  - Left atrium: Mildly dilated at 35 ml/m2.  - Right atrium: The atrium was mildly dilated.  - Inferior vena cava: The vessel was normal in size. The    respirophasic diameter changes were in the normal range (>= 50%),    consistent with normal central venous pressure.   Impressions:  - LVEF 55-60%, mild LVH, normal wall motion, diastolic dysfunction,    normal LV filling pressure, mild biatrial enlargement, normal IVC    size.   Cardiac MRI 10/08/2014: FINDINGS: 1. Mildly dilated left ventricle with normal thickness and normal systolic function (LVEF = 60%).   No regional wall motion abnormalities.   LVEDD:  53 mm   LVESD:  32 mm   LVEDV:  153 ml   LVESV:  61 ml   SV:  92 ml   CO:  6.5 L/minute   Myocardial mass:  158 g   2.  Normal right ventricular size, thickness and systolic function (RVEF = 50%). No regional wall motion abnormalities.   RVEDV:  183 ml   RVESV:  92 ml   SV:  91 ml   CO:  6.5 L/minute   3.  Mild left atrial dilatation.   4.  Mild mitral regurgitation, trivial tricuspid regurgitation.   5. No evidence of late gadolinium enhancement in the myocardium of the left ventricle.   6.  Normal caliber of the aortic root, aorta and pulmonary artery.  IMPRESSION: 1. Mildly dilated left ventricle with normal thickness and normal systolic function (LVEF = 60%).   No regional wall motion abnormalities.   2. Normal right ventricular size, thickness and  systolic function (RVEF = 50%). No regional wall motion abnormalities.   3.  Mild left atrial dilatation.   4.  Mild mitral regurgitation, trivial tricuspid regurgitation.   5. No evidence of late gadolinium enhancement in the myocardium of the left ventricle.  EKG:   ***  Recent Labs: No results found for requested labs within last 365 days.  Recent Lipid Panel    Component Value Date/Time   CHOL 162 03/08/2021 0742   TRIG 132 03/08/2021 0742   HDL 34 (L) 03/08/2021 0742   CHOLHDL 4.8 03/08/2021 0742   CHOLHDL 5 02/02/2017 0812   VLDL 34.2 02/02/2017 0812   LDLCALC 104 (H) 03/08/2021 0742   LDLDIRECT 169.0 12/18/2015 0922     Risk Assessment/Calculations:           Physical Exam:    VS:  There were no vitals taken for this visit.    Wt Readings from Last 3 Encounters:  03/05/21 237 lb (107.5 kg)  05/05/20 235 lb (106.6 kg)  05/21/19 227 lb 12.8 oz (103.3 kg)     GEN: Well nourished, well developed in no acute distress HEENT: Normal NECK: No JVD; No carotid bruits LYMPHATICS: No lymphadenopathy CARDIAC: Bradycardic, regular rhythm, no murmurs, rubs, gallops RESPIRATORY:  Clear to auscultation without rales, wheezing or rhonchi  ABDOMEN: Soft, non-tender, non-distended MUSCULOSKELETAL:  No edema; No deformity   SKIN: Warm and dry NEUROLOGIC:  Alert and oriented x 3 PSYCHIATRIC:  Normal affect   ASSESSMENT:    No diagnosis found.  PLAN:    In order of problems listed above:  #Paroxysmal VT/PVCs: Initially noted in 2008 now s/p ablation. Doing well without symptoms. TTE in 2016 with LVEF 55-60%, no significant valve disease. -Continue metoprolol 25mg  BID  #HLD: -Continue crestor 10mg  daily -Repeat lipids for monitoring; last LDL 104 in 02/2021  #HTN: Well controlled and at goal -Continue metop 25mg  BID      Follow-up in 1 year.  Medication Adjustments/Labs and Tests Ordered: Current medicines are reviewed at length with the patient today.  Concerns regarding medicines are outlined above.  No orders of the defined types were placed in this encounter.   No orders of the defined types were placed in this encounter.    There are no Patient Instructions on file for this visit.    I,Mathew Stumpf,acting as a Education administrator for Freada Bergeron, MD.,have documented all relevant documentation on the behalf of Freada Bergeron, MD,as directed by  Freada Bergeron, MD while in the presence of Freada Bergeron, MD.  I, Freada Bergeron, MD, have reviewed all documentation for this visit. The documentation on 05/12/22 for the exam, diagnosis, procedures, and orders are all accurate and complete.   Signed, Freada Bergeron, MD  05/12/2022 1:45 PM    [facility]

## 2022-05-16 ENCOUNTER — Encounter: Payer: Self-pay | Admitting: *Deleted

## 2022-05-16 ENCOUNTER — Ambulatory Visit: Payer: 59 | Attending: Cardiology | Admitting: Cardiology

## 2022-05-16 ENCOUNTER — Encounter: Payer: Self-pay | Admitting: Cardiology

## 2022-05-16 VITALS — BP 110/74 | HR 59 | Ht 73.0 in | Wt 237.0 lb

## 2022-05-16 DIAGNOSIS — E782 Mixed hyperlipidemia: Secondary | ICD-10-CM

## 2022-05-16 DIAGNOSIS — I10 Essential (primary) hypertension: Secondary | ICD-10-CM | POA: Diagnosis not present

## 2022-05-16 DIAGNOSIS — I493 Ventricular premature depolarization: Secondary | ICD-10-CM | POA: Diagnosis not present

## 2022-05-16 DIAGNOSIS — Z79899 Other long term (current) drug therapy: Secondary | ICD-10-CM

## 2022-05-16 MED ORDER — ROSUVASTATIN CALCIUM 10 MG PO TABS: 10.0000 mg | ORAL_TABLET | Freq: Every day | ORAL | 3 refills | Status: DC

## 2022-05-16 MED ORDER — METOPROLOL TARTRATE 25 MG PO TABS: 25.0000 mg | ORAL_TABLET | Freq: Two times a day (BID) | ORAL | 3 refills | Status: DC

## 2022-05-16 NOTE — Patient Instructions (Signed)
Medication Instructions:   Your physician recommends that you continue on your current medications as directed. Please refer to the Current Medication list given to you today.  *If you need a refill on your cardiac medications before your next appointment, please call your pharmacy*   Lab Work:  TODAY--CMET, CBC W DIFF, TSH, AND LIPIDS  If you have labs (blood work) drawn today and your tests are completely normal, you will receive your results only by: [place] (if you have MyChart) OR A paper copy in the mail If you have any lab test that is abnormal or we need to change your treatment, we will call you to review the results.   Follow-Up: At [facility], you and your health needs are our priority.  As part of our continuing mission to provide you with exceptional heart care, we have created designated Provider Care Teams.  These Care Teams include your primary Cardiologist (physician) and Advanced Practice Providers (APPs -  Physician Assistants and Nurse Practitioners) who all work together to provide you with the care you need, when you need it.  We recommend signing up for the patient portal called "MyChart".  Sign up information is provided on this After Visit Summary.  MyChart is used to connect with patients for Virtual Visits (Telemedicine).  Patients are able to view lab/test results, encounter notes, upcoming appointments, etc.  Non-urgent messages can be sent to your provider as well.   To learn more about what you can do with MyChart, go to [email or web address].    Your next appointment:   1 year(s)  The format for your next appointment:   In Person  Provider:   DR. Johney Frame  Important Information About Sugar

## 2022-05-16 NOTE — Progress Notes (Signed)
Cardiology Office Note:    Date:  05/16/2022   ID:  Larry Rivera, DOB 08-25-1965, MRN [id number]  PCP:  Hoyt Koch, MD   [facility] Providers Cardiologist:  Ena Dawley, MD {   Referring MD: Hoyt Koch, *   No chief complaint on file.   History of Present Illness:    Larry Rivera is a 56 y.o. male with a hx of PVCs/paroxysmal VT s/p ablation 06/2014, hyperlipidemia, diastolic dysfunction without CHF, and obesity who was previously followed by Dr. Meda Coffee who now presents to clinic for follow-up.   Per review of the record, the patient developed monomorphic VT during a stress test in 2008. Subsequent workup was unrevealing as he had preserved left ventricular function and no obstructive coronary artery disease. Cardiac MRI showed normal cardiac chambers, EF 58% without wall motion abnormalities, no evidence of RV dysplasia or fatty infiltration of the right ventricular free wall. In 2015, he was found to have frequent PVCs by EKG and wore a Holter monitor demonstrating 47,000 PVCs in a 24-hour period. TSH wnl. He had ectopy despite BB titration therefore underwent PVC ablation. Last echo 01/23/15 showed mild LVH, EF 51-88%, diastolic dysfunction, mild biatrial enlargement, normal LV filling pressure.    Last seen in clinic on 03/05/21 where he was doing well from a CV standpoint.   Today, he says he has been doing well. He denies any palpitations, chest pain, shortness of breath, or peripheral edema. No lightheadedness, headaches, syncope, orthopnea, or PND.  He reports that he feels good during activities such as mowing his lawn. Blood pressure is well controlled. Compliant with all medications.   Past Medical History:  Diagnosis Date   Allergic rhinitis due to pollen    Frequent PVCs    a. s/p PVC ablation 2015.   Hypertriglyceridemia    Insomnia, unspecified    Obesity    Other and unspecified hyperlipidemia    Paroxysmal ventricular tachycardia  ([place])    a. 2008 - monomorphic VT during stress test - cath with clean coronaries.   Psychosexual dysfunction with inhibited sexual excitement    Unspecified part of closed fracture of clavicle     Past Surgical History:  Procedure Laterality Date   ABLATION  06/17/14   PVC ablation by Dr Lovena Le - originating from the base of the left ventricle at approximately 12:00 just anterior to the mitral valve annulus   CYSTECTOMY     Back of neck   RHINOPLASTY     Deviated septum   V-TACH ABLATION N/A 06/17/2014   Procedure: PVC ABLATION;  Surgeon: Mandrell Lance, MD;  Location: [facility] CATH LAB;  Service: Cardiovascular;  Laterality: N/A;    Current Medications: Current Meds  Medication Sig   Ibuprofen-diphenhydrAMINE Cit (ADVIL PM PO) Take 2 tablets by mouth as needed. For sleep   melatonin 1 MG TABS tablet Take 1 mg by mouth at bedtime as needed (for sleep).   Omega-3 Fatty Acids (FISH OIL) 1000 MG CAPS Take 2 capsules (2,000 mg total) by mouth 2 (two) times daily.   Psyllium (VEGETABLE LAXATIVE PO) Take 4 tablets by mouth as needed (constipation).   [DISCONTINUED] metoprolol tartrate (LOPRESSOR) 25 MG tablet Take 1 tablet (25 mg total) by mouth 2 (two) times daily.   [DISCONTINUED] rosuvastatin (CRESTOR) 10 MG tablet Take 1 tablet (10 mg total) by mouth daily.     Allergies:   Atorvastatin and Penicillins   Social History   Socioeconomic History   Marital  status: Married    Spouse name: Not on file   Number of children: 1   Years of education: 12   Highest education level: Not on file  Occupational History   Occupation: truck Education administrator: [employer]  Tobacco Use   Smoking status: Every Day    Packs/day: 0.50    Years: 30.00    Total pack years: 15.00    Types: Cigars, Cigarettes   Smokeless tobacco: Never   Tobacco comments:    Cigar Only: not every day.  Vaping Use   Vaping Use: Never used  Substance and Sexual Activity   Alcohol use: Yes    Comment: rare drink    Drug use: No   Sexual activity: Yes    Partners: Female  Other Topics Concern   Not on file  Social History Narrative   HSG. Married '91. 1 dtr '95. 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   Social Determinants of Health   Financial Resource Strain: Not on file  Food Insecurity: Not on file  Transportation Needs: Not on file  Physical Activity: Not on file  Stress: Not on file  Social Connections: Not on file     Family History: The patient's family history includes Diabetes in his mother and sister; Hyperlipidemia in his father; Lung disease in his maternal grandmother; Rheum arthritis in his mother; Stroke in his maternal grandmother. There is no history of Cancer or Heart disease.  ROS:   Review of Systems  Constitutional:  Negative for fever and malaise/fatigue.  HENT:  Negative for congestion and hearing loss.   Eyes:  Negative for pain and redness.  Respiratory:  Negative for hemoptysis and shortness of breath.   Cardiovascular:  Negative for chest pain, palpitations, orthopnea, leg swelling and PND.  Gastrointestinal:  Negative for abdominal pain and heartburn.  Genitourinary:  Negative for frequency and urgency.  Musculoskeletal:  Negative for back pain and neck pain.  Neurological:  Negative for dizziness and weakness.  Endo/Heme/Allergies:  Negative for polydipsia.     EKGs/Labs/Other Studies Reviewed:    The following studies were reviewed today:  Echo 01/23/2015 - Left ventricle: The cavity size was normal. Wall thickness was    increased in a pattern of mild LVH. Systolic function was normal.    The estimated ejection fraction was in the range of 55% to 60%.    Wall motion was normal; there were no regional wall motion    abnormalities. Doppler parameters are consistent with abnormal    left ventricular relaxation (grade 1 diastolic dysfunction). The    E/e&' ratio is <8, suggesting normal LV filling pressure.  - Left atrium: Mildly dilated at 35 ml/m2.  - Right  atrium: The atrium was mildly dilated.  - Inferior vena cava: The vessel was normal in size. The    respirophasic diameter changes were in the normal range (>= 50%),    consistent with normal central venous pressure.   Impressions:  - LVEF 55-60%, mild LVH, normal wall motion, diastolic dysfunction,    normal LV filling pressure, mild biatrial enlargement, normal IVC    size.   Cardiac MRI 10/08/2014: FINDINGS: 1. Mildly dilated left ventricle with normal thickness and normal systolic function (LVEF = 60%).   No regional wall motion abnormalities.   LVEDD:  53 mm   LVESD:  32 mm   LVEDV:  153 ml   LVESV:  61 ml   SV:  92 ml   CO:  6.5  L/minute   Myocardial mass:  158 g   2. Normal right ventricular size, thickness and systolic function (RVEF = 50%). No regional wall motion abnormalities.   RVEDV:  183 ml   RVESV:  92 ml   SV:  91 ml   CO:  6.5 L/minute   3.  Mild left atrial dilatation.   4.  Mild mitral regurgitation, trivial tricuspid regurgitation.   5. No evidence of late gadolinium enhancement in the myocardium of the left ventricle.   6.  Normal caliber of the aortic root, aorta and pulmonary artery.  IMPRESSION: 1. Mildly dilated left ventricle with normal thickness and normal systolic function (LVEF = 60%).   No regional wall motion abnormalities.   2. Normal right ventricular size, thickness and systolic function (RVEF = 50%). No regional wall motion abnormalities.   3.  Mild left atrial dilatation.   4.  Mild mitral regurgitation, trivial tricuspid regurgitation.   5. No evidence of late gadolinium enhancement in the myocardium of the left ventricle.  EKG:  EKG is personally reviewed. 05/16/22: Sinus bradycardia. Rate 59 bpm.  Recent Labs: No results found for requested labs within last 365 days.  Recent Lipid Panel    Component Value Date/Time   CHOL 162 03/08/2021 0742   TRIG 132 03/08/2021 0742   HDL 34 (L) 03/08/2021 0742    CHOLHDL 4.8 03/08/2021 0742   CHOLHDL 5 02/02/2017 0812   VLDL 34.2 02/02/2017 0812   LDLCALC 104 (H) 03/08/2021 0742   LDLDIRECT 169.0 12/18/2015 0922     Risk Assessment/Calculations:           Physical Exam:    VS:  BP 110/74 (BP Location: Left Arm, Patient Position: Sitting, Cuff Size: Normal)   Pulse (!) 59   Ht _0  (1.854 m)   Wt 237 lb (107.5 kg)   SpO2 100%   BMI 31.27 kg/m     Wt Readings from Last 3 Encounters:  05/16/22 237 lb (107.5 kg)  03/05/21 237 lb (107.5 kg)  05/05/20 235 lb (106.6 kg)     GEN: Comfortable, well appearing HEENT: Normal NECK: No JVD; No carotid bruits CARDIAC: Bradycardic, regular rhythm, no murmurs, rubs, gallops RESPIRATORY:  Clear to auscultation without rales, wheezing or rhonchi  ABDOMEN: Soft, non-tender, non-distended MUSCULOSKELETAL:  No edema; No deformity  SKIN: Warm and dry NEUROLOGIC:  Alert and oriented x 3 PSYCHIATRIC:  Normal affect   ASSESSMENT:    1. PVC (premature ventricular contraction)   2. Essential hypertension   3. Mixed hyperlipidemia   4. Medication management     PLAN:    In order of problems listed above:  No problem-specific Assessment & Plan notes found for this encounter.  #Paroxysmal VT/PVCs: Initially noted in 2008 now s/p ablation. Doing well without symptoms. TTE in 2016 with LVEF 55-60%, no significant valve disease. -Continue metoprolol 21m BID  #HLD: LDL 104 on last check. Goal <100 given no known coronary disease. Declined Ca score today but may consider in the future -Continue crestor 145mdaily -Repeat lipids for monitoring; last LDL 104 in 02/2021  #HTN: Well controlled and at goal <130/90. -Continue metop 2566mID  Follow-up: 1 year.  Medication Adjustments/Labs and Tests Ordered: Current medicines are reviewed at length with the patient today.  Concerns regarding medicines are outlined above.  Orders Placed This Encounter  Procedures   Comp Met (CMET)   CBC w/Diff    TSH   Lipid Profile   EKG 12-Lead  Meds ordered this encounter  Medications   metoprolol tartrate (LOPRESSOR) 25 MG tablet    Sig: Take 1 tablet (25 mg total) by mouth 2 (two) times daily.    Dispense:  180 tablet    Refill:  3   rosuvastatin (CRESTOR) 10 MG tablet    Sig: Take 1 tablet (10 mg total) by mouth daily.    Dispense:  90 tablet    Refill:  3   Patient Instructions  Medication Instructions:   Your physician recommends that you continue on your current medications as directed. Please refer to the Current Medication list given to you today.  *If you need a refill on your cardiac medications before your next appointment, please call your pharmacy*   Lab Work:  TODAY--CMET, CBC W DIFF, TSH, AND LIPIDS  If you have labs (blood work) drawn today and your tests are completely normal, you will receive your results only by: [place] (if you have MyChart) OR A paper copy in the mail If you have any lab test that is abnormal or we need to change your treatment, we will call you to review the results.   Follow-Up: At [facility], you and your health needs are our priority.  As part of our continuing mission to provide you with exceptional heart care, we have created designated Provider Care Teams.  These Care Teams include your primary Cardiologist (physician) and Advanced Practice Providers (APPs -  Physician Assistants and Nurse Practitioners) who all work together to provide you with the care you need, when you need it.  We recommend signing up for the patient portal called "MyChart".  Sign up information is provided on this After Visit Summary.  MyChart is used to connect with patients for Virtual Visits (Telemedicine).  Patients are able to view lab/test results, encounter notes, upcoming appointments, etc.  Non-urgent messages can be sent to your provider as well.   To learn more about what you can do with MyChart, go to [email or web address].     Your next appointment:   1 year(s)  The format for your next appointment:   In Person  Provider:   DR. Johney Frame  Important Information About Sugar         I,Breanna Adamick,acting as a scribe for Freada Bergeron, MD.,have documented all relevant documentation on the behalf of Freada Bergeron, MD,as directed by  Freada Bergeron, MD while in the presence of Freada Bergeron, MD.   I, Freada Bergeron, MD, have reviewed all documentation for this visit. The documentation on 05/16/22 for the exam, diagnosis, procedures, and orders are all accurate and complete.   Signed, Freada Bergeron, MD  05/16/2022 8:35 AM    [facility]

## 2022-05-17 LAB — CBC WITH DIFFERENTIAL/PLATELET
Basophils Absolute: 0 10*3/uL (ref 0.0–0.2)
Basos: 0 %
EOS (ABSOLUTE): 0.1 10*3/uL (ref 0.0–0.4)
Eos: 1 %
Hematocrit: 46.3 % (ref 37.5–51.0)
Hemoglobin: 15.7 g/dL (ref 13.0–17.7)
Immature Grans (Abs): 0 10*3/uL (ref 0.0–0.1)
Immature Granulocytes: 0 %
Lymphocytes Absolute: 2.5 10*3/uL (ref 0.7–3.1)
Lymphs: 27 %
MCH: 34.1 pg — ABNORMAL HIGH (ref 26.6–33.0)
MCHC: 33.9 g/dL (ref 31.5–35.7)
MCV: 100 fL — ABNORMAL HIGH (ref 79–97)
Monocytes Absolute: 0.6 10*3/uL (ref 0.1–0.9)
Monocytes: 7 %
Neutrophils Absolute: 6 10*3/uL (ref 1.4–7.0)
Neutrophils: 65 %
Platelets: 322 10*3/uL (ref 150–450)
RBC: 4.61 x10E6/uL (ref 4.14–5.80)
RDW: 13.9 % (ref 11.6–15.4)
WBC: 9.2 10*3/uL (ref 3.4–10.8)

## 2022-05-17 LAB — COMPREHENSIVE METABOLIC PANEL
ALT: 30 IU/L (ref 0–44)
AST: 19 IU/L (ref 0–40)
Albumin/Globulin Ratio: 2.1 (ref 1.2–2.2)
Albumin: 4.6 g/dL (ref 3.8–4.9)
Alkaline Phosphatase: 79 IU/L (ref 44–121)
BUN/Creatinine Ratio: 8 — ABNORMAL LOW (ref 9–20)
BUN: 9 mg/dL (ref 6–24)
Bilirubin Total: 0.5 mg/dL (ref 0.0–1.2)
CO2: 25 mmol/L (ref 20–29)
Calcium: 9.4 mg/dL (ref 8.7–10.2)
Chloride: 102 mmol/L (ref 96–106)
Creatinine, Ser: 1.13 mg/dL (ref 0.76–1.27)
Globulin, Total: 2.2 g/dL (ref 1.5–4.5)
Glucose: 99 mg/dL (ref 70–99)
Potassium: 5.1 mmol/L (ref 3.5–5.2)
Sodium: 141 mmol/L (ref 134–144)
Total Protein: 6.8 g/dL (ref 6.0–8.5)
eGFR: 76 mL/min/{1.73_m2} (ref >59)

## 2022-05-17 LAB — LIPID PANEL
Chol/HDL Ratio: 4.5 ratio (ref 0.0–5.0)
Cholesterol, Total: 180 mg/dL (ref 100–199)
HDL: 40 mg/dL (ref >39)
LDL Chol Calc (NIH): 116 mg/dL — ABNORMAL HIGH (ref 0–99)
Triglycerides: 132 mg/dL (ref 0–149)
VLDL Cholesterol Cal: 24 mg/dL (ref 5–40)

## 2022-05-17 LAB — TSH: TSH: 1.45 u[IU]/mL (ref 0.450–4.500)

## 2022-05-18 ENCOUNTER — Telehealth: Payer: Self-pay | Admitting: *Deleted

## 2022-05-18 DIAGNOSIS — E782 Mixed hyperlipidemia: Secondary | ICD-10-CM

## 2022-05-18 DIAGNOSIS — Z79899 Other long term (current) drug therapy: Secondary | ICD-10-CM

## 2022-05-18 MED ORDER — ROSUVASTATIN CALCIUM 20 MG PO TABS: 20.0000 mg | ORAL_TABLET | Freq: Every day | ORAL | 1 refills | Status: DC

## 2022-05-18 NOTE — Telephone Encounter (Signed)
The patient has been notified of the result and verbalized understanding.  All questions (if any) were answered.  Pt states he is taking his crestor everyday.  Pt aware that we will now increase his crestor to 20 mg po daily and have him come in for repeat lipids in 6-8 weeks.  Confirmed the pharmacy of choice with the pt.  Scheduled the pt for repeat lipids in 8 weeks on 07/11/22.  He is aware to come fasting.   Pt verbalized understanding and agrees with this plan.

## 2022-05-18 NOTE — Telephone Encounter (Signed)
-----   Message from Freada Bergeron, MD sent at 05/18/2022  1:19 PM EDT ----- Kidney function, electrolytes, blood counts look good.  LDL went up. Is he taking the crestor regularly? If so, we should bump to 20mg  daily and repeat lipids in 6-8 weeks.

## 2022-07-11 ENCOUNTER — Ambulatory Visit: Payer: 59 | Attending: Cardiology

## 2022-07-11 DIAGNOSIS — Z79899 Other long term (current) drug therapy: Secondary | ICD-10-CM

## 2022-07-11 DIAGNOSIS — E782 Mixed hyperlipidemia: Secondary | ICD-10-CM

## 2022-07-12 LAB — LIPID PANEL
Chol/HDL Ratio: 3.5 ratio (ref 0.0–5.0)
Cholesterol, Total: 135 mg/dL (ref 100–199)
HDL: 39 mg/dL — ABNORMAL LOW (ref >39)
LDL Chol Calc (NIH): 75 mg/dL (ref 0–99)
Triglycerides: 117 mg/dL (ref 0–149)
VLDL Cholesterol Cal: 21 mg/dL (ref 5–40)

## 2022-11-14 ENCOUNTER — Other Ambulatory Visit: Payer: Self-pay

## 2022-11-14 DIAGNOSIS — Z79899 Other long term (current) drug therapy: Secondary | ICD-10-CM

## 2022-11-14 DIAGNOSIS — E782 Mixed hyperlipidemia: Secondary | ICD-10-CM

## 2022-11-14 MED ORDER — ROSUVASTATIN CALCIUM 20 MG PO TABS: 20.0000 mg | ORAL_TABLET | Freq: Every day | ORAL | 1 refills | Status: DC

## 2022-11-22 ENCOUNTER — Telehealth: Payer: Self-pay | Admitting: Cardiology

## 2022-11-22 DIAGNOSIS — I493 Ventricular premature depolarization: Secondary | ICD-10-CM

## 2022-11-22 DIAGNOSIS — E782 Mixed hyperlipidemia: Secondary | ICD-10-CM

## 2022-11-22 DIAGNOSIS — Z79899 Other long term (current) drug therapy: Secondary | ICD-10-CM

## 2022-11-22 DIAGNOSIS — I10 Essential (primary) hypertension: Secondary | ICD-10-CM

## 2022-11-22 NOTE — Telephone Encounter (Signed)
Patient is requesting call back to ask [name], LPN, Dr. Devin Going nurse, questions. He gave no further details. States he will discuss with her.

## 2022-11-22 NOTE — Telephone Encounter (Signed)
Returned call to patient, inquired if there was anything I can assist with. Patient states he has 2-3 different things he would like to discuss with [name], Dr. Devin Going nurse.  Informed patient that Lajoyce Corners is not in the office today, but should be back tomorrow. Patient requests message be forwarded to her and to have [name] call him back tomorrow.  Patient did not want to discuss further, expressed appreciation for forwarding message to [facility].

## 2022-11-23 MED ORDER — METOPROLOL TARTRATE 25 MG PO TABS: 25.0000 mg | ORAL_TABLET | Freq: Two times a day (BID) | ORAL | 3 refills | Status: DC

## 2022-11-23 MED ORDER — ROSUVASTATIN CALCIUM 20 MG PO TABS: 20.0000 mg | ORAL_TABLET | Freq: Every day | ORAL | 2 refills | Status: DC

## 2022-11-23 NOTE — Telephone Encounter (Signed)
Returned a call back to the pt.  He was calling to ask me if it is still too soon to schedule his next follow-up appt with Dr. Shari Prows for Oct.  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  He reminded me that he always gets his labs done a week prior to that appt.   Informed the pt that we unfortunately don't have her schedule out that far yet, but will be working on this soon to publish.   Advised the pt to touch base with me later next month and by that time we should hopefully have that schedule published and ready to arrange his yearly appt for DOT clearance with her in Oct.  He is aware that I will arrange his labs a week prior to, at that time as well, if her schedule is open at that time.    Pt also asked about getting his refills sent in, so that he has enough until his yearly appt with Dr. Shari Prows in Oct.   Refills sent to the pts confirmed pharmacy of choice.  He is aware of this.   Pt verbalized understanding and agrees with this plan.

## 2023-01-04 ENCOUNTER — Telehealth: Payer: Self-pay | Admitting: Cardiology

## 2023-01-04 NOTE — Telephone Encounter (Signed)
Pt is asking will he get a call back to arrange his October follow-up appt with our office.    Pt is aware this is recalled and he will get a notification via mail as well as our schedulers will reach out to him soon to arrange his Oct follow-up appointment with our office.  Pt verbalized understanding and agrees with this plan.

## 2023-01-04 NOTE — Telephone Encounter (Signed)
Patient was returning phone call by to [facility]. Called a couple of month ago and aid that he was told to call back

## 2023-01-23 ENCOUNTER — Telehealth: Payer: Self-pay | Admitting: Cardiology

## 2023-01-23 DIAGNOSIS — Z79899 Other long term (current) drug therapy: Secondary | ICD-10-CM

## 2023-01-23 DIAGNOSIS — I10 Essential (primary) hypertension: Secondary | ICD-10-CM

## 2023-01-23 DIAGNOSIS — E782 Mixed hyperlipidemia: Secondary | ICD-10-CM

## 2023-01-23 NOTE — Telephone Encounter (Signed)
Called the pt back.  He received the news about Dr. Shari Prows leaving in late July.  Pt states he received a letter in the mail telling him to call the office back to schedule his follow-up appt that is due in Oct.  He said the operator offered him an APP appt while awaiting on who his new Gen Cards will be assigned to him.  He states he wanted my feedback first before making the appt.   Pt will need his appt in Oct made, for he will be due for his DOT clearance early Nov.   Scheduled the pt to see Robin Searing NP for 05/22/23 (pt can only attend visits on Mondays) at 0915.  He was advised to arrive 15 mins prior to that appt.   Pt always has labs done a week prior to his yearly visits with our office.  Scheduled him to have his yearly labs done the week before his appt with Alden Server on 05/15/23.  We will check CMET, LIPIDS, TSH, CBC W DIFF at that lab appt.  He is aware to come fasting to this lab appt.    Pt adds at the visit with Alden Server he will need a copy of the EKG and DOT clearance letter provided, for he is due to re-certify in early Nov.  Updated this in appt notes.   Pt verbalized understanding and agrees with this plan.   Will have Dr. Shari Prows chime in on who she wants him to establish with as his new General Cardiologist.

## 2023-01-23 NOTE — Telephone Encounter (Signed)
Pt states that he would like a callback from nurse Lajoyce Corners) if possible. Please advise

## 2023-01-23 NOTE — Telephone Encounter (Signed)
Larry Rivera, Larry Rivera - 01/23/2023 10:52 AM [name], MD  Sent: Mon January 23, 2023  1:39 PM  To: Loa Socks, LPN         Message  If he wishes to stay at [facility], would have him see Dr. Anne Fu. If he wishes to go to [facility], we can have him see Dr. Bjorn Pippin. Both are excellent and will take great care of him!    Will place a one year recall for the pt to establish with Dr. Anne Fu, being he will be see an APP for his upcoming yearly OV appt in Oct.

## 2023-05-15 ENCOUNTER — Ambulatory Visit: Payer: 59 | Attending: Nurse Practitioner

## 2023-05-15 DIAGNOSIS — E782 Mixed hyperlipidemia: Secondary | ICD-10-CM

## 2023-05-15 DIAGNOSIS — Z79899 Other long term (current) drug therapy: Secondary | ICD-10-CM

## 2023-05-15 DIAGNOSIS — I10 Essential (primary) hypertension: Secondary | ICD-10-CM

## 2023-05-15 LAB — CBC WITH DIFFERENTIAL/PLATELET

## 2023-05-16 ENCOUNTER — Other Ambulatory Visit: Payer: 59

## 2023-05-16 LAB — CBC WITH DIFFERENTIAL/PLATELET
Basos: 1 %
EOS (ABSOLUTE): 0.1 10*3/uL (ref 0.0–0.2)
Eos: 1 %
Hematocrit: 44 % (ref 37.5–51.0)
Hemoglobin: 14.7 g/dL (ref 13.0–17.7)
Immature Granulocytes: 0 %
Immature Granulocytes: 0 10*3/uL (ref 0.0–0.1)
Lymphs: 31 %
MCH: 33.9 pg — ABNORMAL HIGH (ref 26.6–33.0)
MCHC: 33.4 g/dL (ref 31.5–35.7)
MCV: 102 fL — ABNORMAL HIGH (ref 79–97)
Monocytes Absolute: 0.1 10*3/uL (ref 0.0–0.4)
Monocytes Absolute: 0.5 10*3/uL (ref 0.1–0.9)
Monocytes: 6 %
Neutrophils Absolute: 2.4 10*3/uL (ref 0.7–3.1)
Neutrophils Absolute: 4.8 10*3/uL (ref 1.4–7.0)
Neutrophils: 61 %
Platelets: 296 10*3/uL (ref 150–450)
RBC: 4.33 x10E6/uL (ref 4.14–5.80)
RDW: 14 % (ref 11.6–15.4)
WBC: 7.9 10*3/uL (ref 3.4–10.8)

## 2023-05-16 LAB — COMPREHENSIVE METABOLIC PANEL
ALT: 23 [IU]/L (ref 0–44)
AST: 16 [IU]/L (ref 0–40)
Albumin: 4.2 g/dL (ref 3.8–4.9)
Alkaline Phosphatase: 81 [IU]/L (ref 44–121)
BUN/Creatinine Ratio: 6 — ABNORMAL LOW (ref 9–20)
BUN: 7 mg/dL (ref 6–24)
Bilirubin Total: 0.5 mg/dL (ref 0.0–1.2)
CO2: 24 mmol/L (ref 20–29)
Calcium: 9.1 mg/dL (ref 8.7–10.2)
Chloride: 106 mmol/L (ref 96–106)
Creatinine, Ser: 1.17 mg/dL (ref 0.76–1.27)
Globulin, Total: 2.2 g/dL (ref 1.5–4.5)
Glucose: 92 mg/dL (ref 70–99)
Potassium: 4.7 mmol/L (ref 3.5–5.2)
Sodium: 142 mmol/L (ref 134–144)
Total Protein: 6.4 g/dL (ref 6.0–8.5)
eGFR: 73 mL/min/{1.73_m2} (ref >59)

## 2023-05-16 LAB — LIPID PANEL
Chol/HDL Ratio: 4.1 {ratio} (ref 0.0–5.0)
Cholesterol, Total: 143 mg/dL (ref 100–199)
HDL: 35 mg/dL — ABNORMAL LOW (ref >39)
LDL Chol Calc (NIH): 88 mg/dL (ref 0–99)
Triglycerides: 106 mg/dL (ref 0–149)
VLDL Cholesterol Cal: 20 mg/dL (ref 5–40)

## 2023-05-16 LAB — TSH: TSH: 2.04 u[IU]/mL (ref 0.450–4.500)

## 2023-05-21 NOTE — Progress Notes (Unsigned)
Cardiology Office Note    Patient Name: Larry Rivera Date of Encounter: 05/21/2023  Primary Care Provider:  Myrlene Broker, MD Primary Cardiologist:  Larry Alexander, MD Primary Electrophysiologist: None   Past Medical History    Past Medical History:  Diagnosis Date   Allergic rhinitis due to pollen    Frequent PVCs    a. s/p PVC ablation 2015.   Hypertriglyceridemia    Insomnia, unspecified    Obesity    Other and unspecified hyperlipidemia    Paroxysmal ventricular tachycardia (HCC)    a. 2008 - monomorphic VT during stress test - cath with clean coronaries.   Psychosexual dysfunction with inhibited sexual excitement    Unspecified part of closed fracture of clavicle     History of Present Illness  Larry Rivera is a 57 y.o. male with a PMH of  hx of PVCs/paroxysmal VT s/p ablation 06/2014, hyperlipidemia, diastolic dysfunction without CHF, and obesity 1 year follow-up.  Larry Rivera was seen initially in 2008 when he developed monomorphic VT during stress test. Cardiac MRI showed normal cardiac chambers, EF 58% without wall motion abnormalities, no evidence of RV dysplasia or fatty infiltration of the right ventricular free wall. In 2015, he was found to have frequent PVCs by EKG and wore a Holter monitor demonstrating 47,000 PVCs in a 24-hour period. TSH wnl. He had ectopy despite BB titration therefore underwent PVC ablation.  He was previously followed by Larry Rivera and then Larry Rivera and will continue to be followed by Larry Rivera.  He was last seen for annual follow-up on 05/16/2022 by Larry Rivera.  During visit patient was doing well with controlled blood pressures compliance with all medications.  He denied any recurrence of tachyarrhythmia last 2D echo was completed 2016 showing EF of 55-60% with no RWMA and grade 1 DD.  During today's visit the patient reports that he has been doing well with no new cardiac complaints or concerns since previous follow-up.  His  blood pressure today was controlled at 120/76 and heart rate was 95 bpm.  He has been compliant with his current medications and denies any adverse reactions.  He is currently not participating in a structured exercise program but notes that he does lots of work around his home such as pressure washing, cutting grass.  He follows a good diet and packs his lunch in his truck for the week.  He also abstains from excess sugar but does like to eat Redbird mints occasionally.  During today's visit we discussed the importance of primary prevention and increasing physical activity to at least 150 minutes/week.  We also discussed the importance of increasing intake of foods high in omega-3 fat such as flaxseed, cold water fish, and avocados.  Patient denies chest pain, palpitations, dyspnea, PND, orthopnea, nausea, vomiting, dizziness, syncope, edema, weight gain, or early satiety.   Review of Systems  Please Rivera the history of present illness.    All other systems reviewed and are otherwise negative except as noted above.  Physical Exam    Wt Readings from Last 3 Encounters:  05/16/22 237 lb (107.5 kg)  03/05/21 237 lb (107.5 kg)  05/05/20 235 lb (106.6 kg)   UV:OZDGU were no vitals filed for this visit.,There is no height or weight on file to calculate BMI. GEN: Well nourished, well developed in no acute distress Neck: No JVD; No carotid bruits Pulmonary: Clear to auscultation without rales, wheezing or rhonchi  Cardiovascular: Normal rate. Regular rhythm. Normal S1.  Normal S2.   Murmurs: There is no murmur.  ABDOMEN: Soft, non-tender, non-distended EXTREMITIES:  No edema; No deformity   EKG/LABS/ Recent Cardiac Studies   ECG personally reviewed by me today -sinus bradycardia with rate of 59 bpm and normal axis with no acute changes consistent with previous EKG.  Risk Assessment/Calculations:          Lab Results  Component Value Date   WBC 7.9 05/15/2023   HGB 14.7 05/15/2023   HCT 44.0  05/15/2023   MCV 102 (H) 05/15/2023   PLT 296 05/15/2023   Lab Results  Component Value Date   CREATININE 1.17 05/15/2023   BUN 7 05/15/2023   NA 142 05/15/2023   K 4.7 05/15/2023   CL 106 05/15/2023   CO2 24 05/15/2023   Lab Results  Component Value Date   CHOL 143 05/15/2023   HDL 35 (L) 05/15/2023   LDLCALC 88 05/15/2023   LDLDIRECT 169.0 12/18/2015   TRIG 106 05/15/2023   CHOLHDL 4.1 05/15/2023    Lab Results  Component Value Date   HGBA1C 6.5 07/21/2014   Assessment & Plan    1.  History of PVC ablation: -s/p PVC ablation 2015 with no recurrence and today sinus bradycardia with rate of 59 bpm. -Continue metoprolol 25 mg twice daily -Patient is aware to stay away from triggers for PVCs such as alcohol, excess caffeine  2.  Essential hypertension: -Patient's blood pressure today was well-controlled at 110/72 -Continue metoprolol 25 mg twice daily  3.  Hyperlipidemia: -Patient's last LDL cholesterol was slightly above goal at 88 -Patient will work on increasing physical activity to at least 150 minutes/week -He was also advised to continue Crestor 20 mg daily  4.  Mild overweight: -Patient's BMI is 29.97 kg/m -He was advised to increase physical activity as noted above.  Disposition: Follow-up with Larry Alexander, MD or APP in 12 months    Signed, Larry Rivera, Larry Rains, NP 05/21/2023, 9:19 AM [facility]

## 2023-05-22 ENCOUNTER — Ambulatory Visit: Payer: 59 | Attending: Nurse Practitioner | Admitting: Nurse Practitioner

## 2023-05-22 ENCOUNTER — Encounter: Payer: Self-pay | Admitting: Nurse Practitioner

## 2023-05-22 VITALS — BP 120/76 | HR 59 | Ht 74.0 in | Wt 233.4 lb

## 2023-05-22 DIAGNOSIS — I493 Ventricular premature depolarization: Secondary | ICD-10-CM | POA: Diagnosis not present

## 2023-05-22 DIAGNOSIS — E66811 Obesity, class 1: Secondary | ICD-10-CM

## 2023-05-22 DIAGNOSIS — I10 Essential (primary) hypertension: Secondary | ICD-10-CM

## 2023-05-22 DIAGNOSIS — E782 Mixed hyperlipidemia: Secondary | ICD-10-CM | POA: Diagnosis not present

## 2023-05-22 DIAGNOSIS — Z79899 Other long term (current) drug therapy: Secondary | ICD-10-CM | POA: Diagnosis not present

## 2023-05-22 MED ORDER — ROSUVASTATIN CALCIUM 20 MG PO TABS
20.0000 mg | ORAL_TABLET | Freq: Every day | ORAL | 3 refills | Status: DC
Start: 2023-05-22 — End: 2024-05-20

## 2023-05-22 MED ORDER — METOPROLOL TARTRATE 25 MG PO TABS: 25.0000 mg | ORAL_TABLET | Freq: Two times a day (BID) | ORAL | 3 refills | Status: DC

## 2023-05-22 NOTE — Patient Instructions (Addendum)
Medication Instructions:  Your physician recommends that you continue on your current medications as directed. Please refer to the Current Medication list given to you today. REFILLED: rosuvastatin and metoprolol  *If you need a refill on your cardiac medications before your next appointment, please call your pharmacy*   Lab Work: NONE If you have labs (blood work) drawn today and your tests are completely normal, you will receive your results only by: MyChart Message (if you have MyChart) OR A paper copy in the mail If you have any lab test that is abnormal or we need to change your treatment, we will call you to review the results.   Testing/Procedures: NONE   Follow-Up: At [facility], you and your health needs are our priority.  As part of our continuing mission to provide you with exceptional heart care, we have created designated Provider Care Teams.  These Care Teams include your primary Cardiologist (physician) and Advanced Practice Providers (APPs -  Physician Assistants and Nurse Practitioners) who all work together to provide you with the care you need, when you need it.   Your next appointment:   1 year(s)  Provider:   Donato Schultz, MD

## 2023-06-16 ENCOUNTER — Other Ambulatory Visit: Payer: Self-pay | Admitting: *Deleted

## 2023-11-13 ENCOUNTER — Telehealth: Payer: Self-pay | Admitting: Nurse Practitioner

## 2023-11-13 NOTE — Telephone Encounter (Signed)
 Pt is calling to ask who he was assigned to establish with in Oct of this year, being Dr. Shari Prows is no longer with the practice.   Pt states he saw Robin Searing NP back in Oct 2024 and he advised for him to follow-up with his new Gen Cards in one year (Oct 2025), but he forgot who Alden Server assigned him to see at that time.   Pt states he has to renew his DOT cardiac clearance every Oct, and wants to make sure he is established with a Gen Cards to see him at that time, being Dr. Shari Prows is no longer here.  Informed the pt that he is recalled to see Dr. Anne Fu in Oct 2025 and he will receive a call back closer to that time to arrange that appt.   Pt aware that Dr. Anne Fu will see him sometime in Oct 2025 (pending scheduling), and will be able to further advise on DOT clearance  for him at that time.   Pt verbalized understanding and agrees with this plan.  Pt was more than gracious for all the assistance provided.

## 2023-11-13 NOTE — Telephone Encounter (Signed)
 Patient is requesting to speak with [facility], LPN, when available. He says he has a few questions and he would like to discuss directly with hger when available.

## 2023-11-13 NOTE — Telephone Encounter (Signed)
 Returned call to patient and he states he wants to speak to [facility] only

## 2024-01-22 ENCOUNTER — Telehealth: Payer: Self-pay | Admitting: Cardiology

## 2024-01-22 DIAGNOSIS — I10 Essential (primary) hypertension: Secondary | ICD-10-CM

## 2024-01-22 DIAGNOSIS — I493 Ventricular premature depolarization: Secondary | ICD-10-CM

## 2024-01-22 DIAGNOSIS — Z79899 Other long term (current) drug therapy: Secondary | ICD-10-CM

## 2024-01-22 DIAGNOSIS — E782 Mixed hyperlipidemia: Secondary | ICD-10-CM

## 2024-01-22 NOTE — Telephone Encounter (Signed)
 Pt requesting blood work and would like to speak with [name]. Please advise

## 2024-01-22 NOTE — Telephone Encounter (Signed)
 Spoke with pt and advised October schedule for Dr Renna Cary is not yet posted.  Encouraged pt to contact scheduling at the end of June for appointment and will forward to Dr Renna Cary for lab orders as pt wold like to have labs completed prior to his appointment.  Pt verbalizes understanding agrees with current plan.

## 2024-01-29 ENCOUNTER — Other Ambulatory Visit: Payer: Self-pay | Admitting: *Deleted

## 2024-01-29 DIAGNOSIS — I493 Ventricular premature depolarization: Secondary | ICD-10-CM

## 2024-01-29 DIAGNOSIS — Z79899 Other long term (current) drug therapy: Secondary | ICD-10-CM

## 2024-01-29 DIAGNOSIS — I10 Essential (primary) hypertension: Secondary | ICD-10-CM

## 2024-01-29 DIAGNOSIS — E782 Mixed hyperlipidemia: Secondary | ICD-10-CM

## 2024-01-29 NOTE — Telephone Encounter (Signed)
 Lab orders placed per Dr Renna Cary

## 2024-01-29 NOTE — Telephone Encounter (Signed)
 Needs appt in October for DOT physical.  He will call back closer to then to schedule.

## 2024-05-13 LAB — CBC
Hematocrit: 46.7 % (ref 37.5–51.0)
Hemoglobin: 15.6 g/dL (ref 13.0–17.7)
MCH: 33.5 pg — ABNORMAL HIGH (ref 26.6–33.0)
MCHC: 33.4 g/dL (ref 31.5–35.7)
MCV: 100 fL — ABNORMAL HIGH (ref 79–97)
Platelets: 276 x10E3/uL (ref 150–450)
RBC: 4.65 x10E6/uL (ref 4.14–5.80)
RDW: 13 % (ref 11.6–15.4)
WBC: 7.7 x10E3/uL (ref 3.4–10.8)

## 2024-05-13 LAB — COMPREHENSIVE METABOLIC PANEL WITH GFR
ALT: 17 IU/L (ref 0–44)
AST: 17 IU/L (ref 0–40)
Albumin: 4.2 g/dL (ref 3.8–4.9)
Alkaline Phosphatase: 88 IU/L (ref 47–123)
BUN/Creatinine Ratio: 7 — ABNORMAL LOW (ref 9–20)
BUN: 8 mg/dL (ref 6–24)
Bilirubin Total: 0.7 mg/dL (ref 0.0–1.2)
CO2: 22 mmol/L (ref 20–29)
Calcium: 9.1 mg/dL (ref 8.7–10.2)
Chloride: 103 mmol/L (ref 96–106)
Creatinine, Ser: 1.13 mg/dL (ref 0.76–1.27)
Globulin, Total: 2.3 g/dL (ref 1.5–4.5)
Glucose: 80 mg/dL (ref 70–99)
Potassium: 4.9 mmol/L (ref 3.5–5.2)
Sodium: 139 mmol/L (ref 134–144)
Total Protein: 6.5 g/dL (ref 6.0–8.5)
eGFR: 75 mL/min/1.73 (ref >59)

## 2024-05-13 LAB — LIPID PANEL

## 2024-05-14 ENCOUNTER — Ambulatory Visit: Payer: Self-pay | Admitting: Cardiology

## 2024-05-14 LAB — LIPID PANEL
Cholesterol, Total: 149 mg/dL (ref 100–199)
HDL: 37 mg/dL — ABNORMAL LOW (ref >39)
LDL CALC COMMENT:: 4 ratio (ref 0.0–5.0)
LDL Chol Calc (NIH): 91 mg/dL (ref 0–99)
Triglycerides: 117 mg/dL (ref 0–149)
VLDL Cholesterol Cal: 21 mg/dL (ref 5–40)

## 2024-05-14 LAB — TSH: TSH: 2.31 u[IU]/mL (ref 0.450–4.500)

## 2024-05-20 ENCOUNTER — Encounter: Payer: Self-pay | Admitting: Cardiology

## 2024-05-20 ENCOUNTER — Ambulatory Visit: Attending: Cardiology | Admitting: Cardiology

## 2024-05-20 VITALS — BP 118/71 | HR 70 | Ht 74.0 in | Wt 237.0 lb

## 2024-05-20 DIAGNOSIS — Z79899 Other long term (current) drug therapy: Secondary | ICD-10-CM | POA: Diagnosis not present

## 2024-05-20 DIAGNOSIS — I10 Essential (primary) hypertension: Secondary | ICD-10-CM

## 2024-05-20 DIAGNOSIS — E782 Mixed hyperlipidemia: Secondary | ICD-10-CM

## 2024-05-20 DIAGNOSIS — I493 Ventricular premature depolarization: Secondary | ICD-10-CM

## 2024-05-20 MED ORDER — METOPROLOL TARTRATE 25 MG PO TABS: 25.0000 mg | ORAL_TABLET | Freq: Two times a day (BID) | ORAL | 3 refills | Status: AC

## 2024-05-20 MED ORDER — ROSUVASTATIN CALCIUM 20 MG PO TABS
20.0000 mg | ORAL_TABLET | Freq: Every day | ORAL | 3 refills | Status: AC
Start: 2024-05-20 — End: ?

## 2024-05-20 NOTE — Patient Instructions (Signed)
 Medication Instructions:  Your physician recommends that you continue on your current medications as directed. Please refer to the Current Medication list given to you today.  *If you need a refill on your cardiac medications before your next appointment, please call your pharmacy*  Lab Work: NONE ordered at this time of appointment   Testing/Procedures: NONE ordered at this time of appointment   Follow-Up: At [facility], you and your health needs are our priority.  As part of our continuing mission to provide you with exceptional heart care, our providers are all part of one team.  This team includes your primary Cardiologist (physician) and Advanced Practice Providers or APPs (Physician Assistants and Nurse Practitioners) who all work together to provide you with the care you need, when you need it.  Your next appointment:   1 year(s)  Provider:   Oneil Parchment, MD    We recommend signing up for the patient portal called MyChart.  Sign up information is provided on this After Visit Summary.  MyChart is used to connect with patients for Virtual Visits (Telemedicine).  Patients are able to view lab/test results, encounter notes, upcoming appointments, etc.  Non-urgent messages can be sent to your provider as well.   To learn more about what you can do with MyChart, go to [email or web address].

## 2024-05-20 NOTE — Progress Notes (Signed)
 Cardiology Office Note:  .   Date:  05/20/2024  ID:  Alm GORMAN Sar, DOB 07/18/1966, MRN [id number] PCP: Rollene Almarie LABOR, MD  [facility] Providers Cardiologist:  Oneil Parchment, MD     History of Present Illness: .   Larry Rivera is a 58 y.o. male Discussed the use of AI scribe software   History of Present Illness Larry Rivera is a 58 year old male with PVCs and paroxysmal VT who presents for follow-up.  He has a history of premature ventricular contractions (PVCs) and paroxysmal ventricular tachycardia (VT). In November 2015, he underwent a PVC ablation after experiencing 47,000 PVCs in a 24-hour period. Despite beta blocker titration, ectopy persisted prior to the ablation.  He was initially evaluated in 2008 when monomorphic VT was detected during a stress test. A cardiac MRI at that time revealed normal cardiac chambers with an ejection fraction (EF) of 58%, no wall motion abnormalities, and no evidence of right ventricular dysplasia or fatty infiltration.  In 2023, an echocardiogram showed an EF of 60%. No chest pain, fainting episodes, or shortness of breath. He remains compliant with his medications, including metoprolol  25 mg twice a day, omega-3 fish oil  2 grams twice a day, and rosuvastatin  20 mg daily. He engages in physical activities such as pressure washing and cutting grass.  His blood pressure is well controlled. Recent lab work showed a hemoglobin of 14.7, creatinine of 1.1, and LDL of 88. He has a history of hyperlipidemia and diastolic dysfunction without heart failure. His thyroid function has been normal.  He has been a truck driver for 27 years and engages in physical activities such as yard work.      Studies Reviewed: SABRA   EKG Interpretation Date/Time:  Monday May 20 2024 08:57:28 EDT Ventricular Rate:  69 PR Interval:  140 QRS Duration:  82 QT Interval:  384 QTC Calculation: 411 R Axis:   17  Text Interpretation: Normal sinus rhythm  Normal ECG When compared with ECG of 22-May-2023 09:08, No significant change was found Confirmed by Parchment Oneil ([id number]) on 05/20/2024 9:22:38 AM    Results LABS Hemoglobin: 14.7 Creatinine: 1.1 LDL: 91 (05/13/2024) Creatinine: 1.13 (05/13/2024) Potassium: 4.9 (05/13/2024) ALT: 17 (05/13/2024)  RADIOLOGY Cardiac MRI: Normal cardiac chambers, EF 58%, no wall motion abnormalities, no evidence of RV dysplasia or fatty infiltration of the right ventricular free wall (2008)  DIAGNOSTIC EKG: 47,000 PVCs in a 24-hour period (2015) Echocardiogram: EF 60% (2023) EKG: Normal Risk Assessment/Calculations:            Physical Exam:   VS:  BP 118/71 (BP Location: Left Arm, Patient Position: Sitting, Cuff Size: Normal)   Pulse 70   Ht 6' 2 (1.88 m)   Wt 237 lb (107.5 kg)   SpO2 97%   BMI 30.43 kg/m    Wt Readings from Last 3 Encounters:  05/20/24 237 lb (107.5 kg)  05/22/23 233 lb 6.4 oz (105.9 kg)  05/16/22 237 lb (107.5 kg)    GEN: Well nourished, well developed in no acute distress NECK: No JVD; No carotid bruits CARDIAC: RRR, no murmurs, no rubs, no gallops RESPIRATORY:  Clear to auscultation without rales, wheezing or rhonchi  ABDOMEN: Soft, non-tender, non-distended EXTREMITIES:  No edema; No deformity   ASSESSMENT AND PLAN: .    Assessment and Plan Assessment & Plan Ventricular tachycardia and frequent PVCs, status post ablation Status post PVC ablation in November 2015 with no recurrence of PVCs or ventricular tachycardia.  EKG today shows no abnormalities. No symptoms of chest pain, fainting, or shortness of breath. Continues to perform physical activities without issues. Ablation was performed due to frequent PVCs and the need to avoid medication that could interfere with DOT physical requirements. - Provide a copy of the EKG for DOT physical. - Ensure clearance letter is available for DOT physical. - He may proceed from a cardiology standpoint with low risk for DOT  physical.  Stable with no syncope, no chest pain, no adverse symptoms.  Diastolic dysfunction without heart failure No evidence of heart failure symptoms. Echocardiogram in 2023 showed EF of 60%. Continues to be asymptomatic with good functional capacity.  Essential hypertension Blood pressure is well controlled on current medication regimen. - Continue metoprolol  25 mg twice a day. - Renew prescription for metoprolol .  Hyperlipidemia LDL cholesterol is 91 mg/dL, which is within acceptable range. Continues on rosuvastatin  and omega-3 fish oil  for management. - Renew prescription for rosuvastatin  20 mg daily. - Continue omega-3 fish oil  2 grams twice a day.         Dispo: 1 yr  Signed, Oneil Parchment, MD
